# Patient Record
Sex: Female | Born: 1960 | Race: Black or African American | Hispanic: No | Smoking: Current every day smoker
Health system: Southern US, Community
[De-identification: ages and names within clinical notes are randomized; demographics above are authoritative.]

## PROBLEM LIST (undated history)

## (undated) DIAGNOSIS — I341 Nonrheumatic mitral (valve) prolapse: Secondary | ICD-10-CM

## (undated) DIAGNOSIS — K219 Gastro-esophageal reflux disease without esophagitis: Secondary | ICD-10-CM

## (undated) DIAGNOSIS — J329 Chronic sinusitis, unspecified: Secondary | ICD-10-CM

## (undated) HISTORY — PX: ABDOMINAL HYSTERECTOMY: SHX81

## (undated) HISTORY — PX: BUNIONECTOMY: SHX129

## (undated) HISTORY — PX: TONSILLECTOMY: SUR1361

## (undated) HISTORY — PX: APPENDECTOMY: SHX54

---

## 1998-01-11 ENCOUNTER — Emergency Department (HOSPITAL_COMMUNITY): Admission: EM | Admit: 1998-01-11 | Discharge: 1998-01-11 | Payer: Self-pay | Admitting: Emergency Medicine

## 1999-02-24 ENCOUNTER — Emergency Department (HOSPITAL_COMMUNITY): Admission: EM | Admit: 1999-02-24 | Discharge: 1999-02-24 | Payer: Self-pay | Admitting: Internal Medicine

## 1999-11-01 ENCOUNTER — Ambulatory Visit (HOSPITAL_COMMUNITY): Admission: RE | Admit: 1999-11-01 | Discharge: 1999-11-01 | Payer: Self-pay | Admitting: Gastroenterology

## 1999-12-05 ENCOUNTER — Emergency Department (HOSPITAL_COMMUNITY): Admission: EM | Admit: 1999-12-05 | Discharge: 1999-12-05 | Payer: Self-pay | Admitting: Emergency Medicine

## 2000-10-03 ENCOUNTER — Encounter: Payer: Self-pay | Admitting: Gastroenterology

## 2000-10-03 ENCOUNTER — Encounter: Admission: RE | Admit: 2000-10-03 | Discharge: 2000-10-03 | Payer: Self-pay | Admitting: Gastroenterology

## 2000-11-21 ENCOUNTER — Encounter: Payer: Self-pay | Admitting: *Deleted

## 2000-11-22 ENCOUNTER — Observation Stay (HOSPITAL_COMMUNITY): Admission: RE | Admit: 2000-11-22 | Discharge: 2000-11-24 | Payer: Self-pay | Admitting: *Deleted

## 2000-11-22 ENCOUNTER — Encounter (INDEPENDENT_AMBULATORY_CARE_PROVIDER_SITE_OTHER): Payer: Self-pay

## 2000-11-23 ENCOUNTER — Encounter: Payer: Self-pay | Admitting: *Deleted

## 2002-08-18 ENCOUNTER — Emergency Department (HOSPITAL_COMMUNITY): Admission: EM | Admit: 2002-08-18 | Discharge: 2002-08-18 | Payer: Self-pay | Admitting: Emergency Medicine

## 2002-12-22 ENCOUNTER — Encounter: Payer: Self-pay | Admitting: Obstetrics and Gynecology

## 2002-12-22 ENCOUNTER — Ambulatory Visit (HOSPITAL_COMMUNITY): Admission: RE | Admit: 2002-12-22 | Discharge: 2002-12-22 | Payer: Self-pay | Admitting: Obstetrics and Gynecology

## 2003-08-11 ENCOUNTER — Other Ambulatory Visit: Admission: RE | Admit: 2003-08-11 | Discharge: 2003-08-11 | Payer: Self-pay | Admitting: Obstetrics and Gynecology

## 2004-01-05 ENCOUNTER — Encounter: Admission: RE | Admit: 2004-01-05 | Discharge: 2004-01-05 | Payer: Self-pay | Admitting: Internal Medicine

## 2004-02-23 ENCOUNTER — Emergency Department (HOSPITAL_COMMUNITY): Admission: EM | Admit: 2004-02-23 | Discharge: 2004-02-23 | Payer: Self-pay | Admitting: Family Medicine

## 2004-02-25 ENCOUNTER — Emergency Department (HOSPITAL_COMMUNITY): Admission: EM | Admit: 2004-02-25 | Discharge: 2004-02-25 | Payer: Self-pay | Admitting: Family Medicine

## 2004-05-13 ENCOUNTER — Emergency Department (HOSPITAL_COMMUNITY): Admission: EM | Admit: 2004-05-13 | Discharge: 2004-05-13 | Payer: Self-pay | Admitting: Family Medicine

## 2005-09-16 ENCOUNTER — Emergency Department (HOSPITAL_COMMUNITY): Admission: EM | Admit: 2005-09-16 | Discharge: 2005-09-16 | Payer: Self-pay | Admitting: Family Medicine

## 2005-11-14 ENCOUNTER — Emergency Department (HOSPITAL_COMMUNITY): Admission: EM | Admit: 2005-11-14 | Discharge: 2005-11-14 | Payer: Self-pay | Admitting: Family Medicine

## 2006-04-13 ENCOUNTER — Emergency Department (HOSPITAL_COMMUNITY): Admission: EM | Admit: 2006-04-13 | Discharge: 2006-04-13 | Payer: Self-pay | Admitting: Family Medicine

## 2006-07-10 ENCOUNTER — Emergency Department (HOSPITAL_COMMUNITY): Admission: EM | Admit: 2006-07-10 | Discharge: 2006-07-10 | Payer: Self-pay | Admitting: Family Medicine

## 2007-05-04 ENCOUNTER — Emergency Department (HOSPITAL_COMMUNITY): Admission: EM | Admit: 2007-05-04 | Discharge: 2007-05-04 | Payer: Self-pay | Admitting: Emergency Medicine

## 2007-11-14 ENCOUNTER — Encounter: Admission: RE | Admit: 2007-11-14 | Discharge: 2007-11-14 | Payer: Self-pay | Admitting: Obstetrics and Gynecology

## 2007-11-27 ENCOUNTER — Encounter: Admission: RE | Admit: 2007-11-27 | Discharge: 2007-11-27 | Payer: Self-pay | Admitting: Obstetrics and Gynecology

## 2008-04-10 ENCOUNTER — Emergency Department (HOSPITAL_COMMUNITY): Admission: EM | Admit: 2008-04-10 | Discharge: 2008-04-10 | Payer: Self-pay | Admitting: Family Medicine

## 2008-04-16 ENCOUNTER — Inpatient Hospital Stay (HOSPITAL_COMMUNITY): Admission: EM | Admit: 2008-04-16 | Discharge: 2008-04-17 | Payer: Self-pay | Admitting: *Deleted

## 2008-12-11 ENCOUNTER — Encounter: Admission: RE | Admit: 2008-12-11 | Discharge: 2008-12-11 | Payer: Self-pay | Admitting: Obstetrics and Gynecology

## 2009-01-18 ENCOUNTER — Emergency Department (HOSPITAL_COMMUNITY): Admission: EM | Admit: 2009-01-18 | Discharge: 2009-01-18 | Payer: Self-pay | Admitting: Family Medicine

## 2009-02-05 ENCOUNTER — Inpatient Hospital Stay (HOSPITAL_COMMUNITY): Admission: EM | Admit: 2009-02-05 | Discharge: 2009-02-05 | Payer: Self-pay | Admitting: Emergency Medicine

## 2009-09-25 ENCOUNTER — Emergency Department (HOSPITAL_COMMUNITY): Admission: EM | Admit: 2009-09-25 | Discharge: 2009-09-25 | Payer: Self-pay | Admitting: Emergency Medicine

## 2009-09-26 ENCOUNTER — Inpatient Hospital Stay (HOSPITAL_COMMUNITY): Admission: EM | Admit: 2009-09-26 | Discharge: 2009-10-08 | Payer: Self-pay | Admitting: Emergency Medicine

## 2009-10-12 ENCOUNTER — Emergency Department (HOSPITAL_COMMUNITY): Admission: EM | Admit: 2009-10-12 | Discharge: 2009-10-12 | Payer: Self-pay | Admitting: Family Medicine

## 2010-10-19 LAB — CBC
HCT: 40.8 % (ref 36.0–46.0)
Hemoglobin: 11.2 g/dL — ABNORMAL LOW (ref 12.0–15.0)
Hemoglobin: 13.3 g/dL (ref 12.0–15.0)
MCHC: 32.7 g/dL (ref 30.0–36.0)
MCHC: 33.2 g/dL (ref 30.0–36.0)
MCV: 84 fL (ref 78.0–100.0)
MCV: 84.5 fL (ref 78.0–100.0)
Platelets: 241 10*3/uL (ref 150–400)
RBC: 4.02 MIL/uL (ref 3.87–5.11)
RBC: 4.83 MIL/uL (ref 3.87–5.11)
RDW: 15.1 % (ref 11.5–15.5)
RDW: 15.2 % (ref 11.5–15.5)
WBC: 7.2 10*3/uL (ref 4.0–10.5)

## 2010-10-19 LAB — WET PREP, GENITAL: Yeast Wet Prep HPF POC: NONE SEEN

## 2010-10-19 LAB — DIFFERENTIAL
Basophils Absolute: 0 10*3/uL (ref 0.0–0.1)
Basophils Relative: 0 % (ref 0–1)
Basophils Relative: 0 % (ref 0–1)
Eosinophils Absolute: 0 10*3/uL (ref 0.0–0.7)
Eosinophils Absolute: 0 10*3/uL (ref 0.0–0.7)
Lymphocytes Relative: 10 % — ABNORMAL LOW (ref 12–46)
Lymphs Abs: 0.3 10*3/uL — ABNORMAL LOW (ref 0.7–4.0)
Monocytes Absolute: 0.6 10*3/uL (ref 0.1–1.0)
Monocytes Absolute: 0.7 10*3/uL (ref 0.1–1.0)
Neutro Abs: 2.4 10*3/uL (ref 1.7–7.7)
Neutro Abs: 5.6 10*3/uL (ref 1.7–7.7)
Neutrophils Relative %: 77 % (ref 43–77)

## 2010-10-19 LAB — HEPATIC FUNCTION PANEL
AST: 20 U/L (ref 0–37)
Albumin: 4 g/dL (ref 3.5–5.2)
Alkaline Phosphatase: 52 U/L (ref 39–117)
Bilirubin, Direct: 0.1 mg/dL (ref 0.0–0.3)

## 2010-10-19 LAB — GC/CHLAMYDIA PROBE AMP, GENITAL
Chlamydia, DNA Probe: NEGATIVE
GC Probe Amp, Genital: NEGATIVE

## 2010-10-19 LAB — POCT I-STAT, CHEM 8
Chloride: 102 mEq/L (ref 96–112)
Glucose, Bld: 92 mg/dL (ref 70–99)
Hemoglobin: 15.3 g/dL — ABNORMAL HIGH (ref 12.0–15.0)
Sodium: 139 mEq/L (ref 135–145)

## 2010-10-19 LAB — BASIC METABOLIC PANEL
BUN: 3 mg/dL — ABNORMAL LOW (ref 6–23)
Calcium: 8.5 mg/dL (ref 8.4–10.5)
Creatinine, Ser: 0.75 mg/dL (ref 0.4–1.2)
GFR calc Af Amer: >60 mL/min (ref >60)
Potassium: 4 mEq/L (ref 3.5–5.1)
Sodium: 136 mEq/L (ref 135–145)

## 2010-10-23 LAB — URINALYSIS, ROUTINE W REFLEX MICROSCOPIC
Bilirubin Urine: NEGATIVE
Ketones, ur: NEGATIVE mg/dL
Nitrite: NEGATIVE
Protein, ur: NEGATIVE mg/dL
pH: 6 (ref 5.0–8.0)

## 2010-10-23 LAB — BASIC METABOLIC PANEL
BUN: 3 mg/dL — ABNORMAL LOW (ref 6–23)
BUN: 3 mg/dL — ABNORMAL LOW (ref 6–23)
CO2: 30 mEq/L (ref 19–32)
Calcium: 8.3 mg/dL — ABNORMAL LOW (ref 8.4–10.5)
Creatinine, Ser: 0.71 mg/dL (ref 0.4–1.2)
Creatinine, Ser: 0.72 mg/dL (ref 0.4–1.2)
GFR calc non Af Amer: >60 mL/min (ref >60)
GFR calc non Af Amer: >60 mL/min (ref >60)
Glucose, Bld: 109 mg/dL — ABNORMAL HIGH (ref 70–99)
Glucose, Bld: 111 mg/dL — ABNORMAL HIGH (ref 70–99)
Potassium: 3.9 mEq/L (ref 3.5–5.1)
Sodium: 137 mEq/L (ref 135–145)

## 2010-10-23 LAB — CBC
HCT: 33.3 % — ABNORMAL LOW (ref 36.0–46.0)
Hemoglobin: 11.1 g/dL — ABNORMAL LOW (ref 12.0–15.0)
MCHC: 33.2 g/dL (ref 30.0–36.0)
MCHC: 33.3 g/dL (ref 30.0–36.0)
MCV: 85.3 fL (ref 78.0–100.0)
Platelets: 185 10*3/uL (ref 150–400)
Platelets: 201 10*3/uL (ref 150–400)
RBC: 3.91 MIL/uL (ref 3.87–5.11)
RDW: 14.9 % (ref 11.5–15.5)
RDW: 15.2 % (ref 11.5–15.5)
WBC: 4.2 10*3/uL (ref 4.0–10.5)

## 2010-10-23 LAB — WOUND CULTURE

## 2010-10-23 LAB — PHOSPHORUS: Phosphorus: 3.4 mg/dL (ref 2.3–4.6)

## 2010-10-23 LAB — MAGNESIUM: Magnesium: 1.4 mg/dL — ABNORMAL LOW (ref 1.5–2.5)

## 2010-11-06 LAB — URINALYSIS, ROUTINE W REFLEX MICROSCOPIC
Bilirubin Urine: NEGATIVE
Glucose, UA: NEGATIVE mg/dL
Hgb urine dipstick: NEGATIVE
Specific Gravity, Urine: 1.01 (ref 1.005–1.030)

## 2010-11-06 LAB — CBC
HCT: 33.7 % — ABNORMAL LOW (ref 36.0–46.0)
HCT: 39 % (ref 36.0–46.0)
Hemoglobin: 13.2 g/dL (ref 12.0–15.0)
MCHC: 33.8 g/dL (ref 30.0–36.0)
MCV: 87.2 fL (ref 78.0–100.0)
MCV: 87.6 fL (ref 78.0–100.0)
Platelets: 207 10*3/uL (ref 150–400)
RBC: 3.84 MIL/uL — ABNORMAL LOW (ref 3.87–5.11)
RBC: 4.48 MIL/uL (ref 3.87–5.11)
WBC: 6.8 10*3/uL (ref 4.0–10.5)
WBC: 7.1 10*3/uL (ref 4.0–10.5)

## 2010-11-06 LAB — BASIC METABOLIC PANEL
Chloride: 105 mEq/L (ref 96–112)
Creatinine, Ser: 0.75 mg/dL (ref 0.4–1.2)
GFR calc Af Amer: >60 mL/min (ref >60)
GFR calc non Af Amer: >60 mL/min (ref >60)
Potassium: 4.2 mEq/L (ref 3.5–5.1)

## 2010-11-06 LAB — POCT I-STAT, CHEM 8
BUN: 7 mg/dL (ref 6–23)
Chloride: 98 mEq/L (ref 96–112)
HCT: 43 % (ref 36.0–46.0)
Sodium: 135 mEq/L (ref 135–145)

## 2010-11-06 LAB — DIFFERENTIAL
Basophils Absolute: 0 10*3/uL (ref 0.0–0.1)
Basophils Relative: 0 % (ref 0–1)
Lymphocytes Relative: 17 % (ref 12–46)
Neutro Abs: 5.1 10*3/uL (ref 1.7–7.7)

## 2010-11-06 LAB — MAGNESIUM: Magnesium: 2 mg/dL (ref 1.5–2.5)

## 2010-11-06 LAB — GLUCOSE, CAPILLARY: Glucose-Capillary: 106 mg/dL — ABNORMAL HIGH (ref 70–99)

## 2010-11-16 DIAGNOSIS — I10 Essential (primary) hypertension: Secondary | ICD-10-CM | POA: Insufficient documentation

## 2010-11-16 DIAGNOSIS — R21 Rash and other nonspecific skin eruption: Secondary | ICD-10-CM | POA: Insufficient documentation

## 2010-11-16 DIAGNOSIS — L251 Unspecified contact dermatitis due to drugs in contact with skin: Secondary | ICD-10-CM | POA: Insufficient documentation

## 2010-11-17 ENCOUNTER — Emergency Department (HOSPITAL_COMMUNITY)
Admission: EM | Admit: 2010-11-17 | Discharge: 2010-11-17 | Disposition: A | Discharge status: Home / Self Care | Payer: No Typology Code available for payment source | Attending: Emergency Medicine | Admitting: Emergency Medicine

## 2010-12-01 ENCOUNTER — Other Ambulatory Visit: Payer: Self-pay | Admitting: Obstetrics and Gynecology

## 2010-12-01 DIAGNOSIS — Z1231 Encounter for screening mammogram for malignant neoplasm of breast: Secondary | ICD-10-CM

## 2010-12-12 ENCOUNTER — Ambulatory Visit
Admission: RE | Admit: 2010-12-12 | Discharge: 2010-12-12 | Disposition: A | Discharge status: Home / Self Care | Payer: No Typology Code available for payment source | Source: Ambulatory Visit | Attending: Obstetrics and Gynecology | Admitting: Obstetrics and Gynecology

## 2010-12-12 DIAGNOSIS — Z1231 Encounter for screening mammogram for malignant neoplasm of breast: Secondary | ICD-10-CM

## 2010-12-13 NOTE — Cardiovascular Report (Signed)
NAME:  Cynthia Palmer, Cynthia Palmer NO.:  [id number]   MEDICAL RECORD NO.:  [id number]          PATIENT TYPE:  INP   LOCATION:  4734                         FACILITY:  [facility]   PHYSICIAN:  Nanetta Batty, M.D.   DATE OF BIRTH:  02/17/1961   DATE OF PROCEDURE:  04/16/2008  DATE OF DISCHARGE:                            CARDIAC CATHETERIZATION   [name] is a 50 year old African American female, mother of 2  children who was admitted to the ER today with chest pain, which began  last night.  She had no acute EKG changes, and her enzymes were  negative.  She did have a negative Myoview a year ago.  Other problems  include tobacco abuse, treated hypertension, and a strong family history  for heart disease.  She presents now for diagnostic coronary  arteriography to define anatomy and rule out ischemic etiology.   DESCRIPTION OF PROCEDURE:  The patient was brought to the Second Floor  [facility] in the postabsorptive state.  She was  premedicated with p.o. Valium.  Her right groin was prepped and shaved  in the usual sterile fashion.  Xylocaine 1% was used for local  anesthesia.  A 6-French sheath was inserted into the right femoral  artery using standard Seldinger technique.  A 6-French right-left  Judkins diagnostic catheter as well as 6-French pigtail catheter were  used for selective cholangiography, left ventriculography, and  supravalvular aortography.  Visipaque dye was used for the entirety of  the case.  Retrograde aortic, left ventricular, and pullback pressures  were recorded.   HEMODYNAMICS:  1. Aortic systolic pressure 91, diastolic pressure 55.  2. Ventricle systolic pressure 94, end-diastolic pressure 9.   Selective coronary angiography:  1. Left main normal.  2. LAD normal.  3. Left circumflex normal.  4. Right coronary artery was dominant and normal.   Left ventriculography; RAO left ventriculogram was performed using 25 mL  of Visipaque  dye at 12 mL per second.  The overall LVEF was estimated  greater than 60% without focal wall motion abnormalities.   Supravalvular aortography; performed in the LAO view using 20 mL of  Visipaque dye at 20 mL per second.  There was no AI noted.  There was  moderate dilatation of the super annular ascending aorta, which tapered  back to normal in the arch.  There is no dissection.  Arch vessels were  intact.   IMPRESSION:  [name] has normal coronaries arteries, normal left  ventricular function.  Her aortic root appears generous.  I am going to  get a CT angiogram to size her aortic root and rule out thoracic aortic  aneurysm.  If this is within normal limits, then further evaluation for  noncardiac chest pain including gastrointestinal evaluation, plus/minus  EGG/gallbladder ultrasound may be required.   The sheath was removed and pressure on the groin to achieve hemostasis.  The patient left the lab in stable condition.  Dr. Fleet Contras have  been notified of these results.      Nanetta Batty, M.D.  Electronically Signed     JB/MEDQ  D:  04/16/2008  T:  04/17/2008  Job:  [id number]   cc:   Second [facility]  [facility]  Fleet Contras, MD

## 2010-12-16 NOTE — Discharge Summary (Signed)
Cynthia Palmer, Cynthia Palmer            ACCOUNT NO.:  [id number]   MEDICAL RECORD NO.:  [id number]          PATIENT TYPE:  INP   LOCATION:  4734                         FACILITY:  [facility]   PHYSICIAN:  Fleet Contras, M.D.    DATE OF BIRTH:  1960-08-28   DATE OF ADMISSION:  04/16/2008  DATE OF DISCHARGE:  04/17/2008                               DISCHARGE SUMMARY   HISTORY OF PRESENTING ILLNESS:  [name] is a 50 year old lady with  past medical history significant for hypertension, mitral valve  prolapse, allergic rhinitis, bronchitis, gastroesophageal reflux  disease, tobacco abuse, and recurrent chest pain.  She presented with  another episode of atypical chest pain, but due to her previous history,  Cardiology consult was requested.   HOSPITAL COURSE:  The patient was seen by Dr. [facility].  The patient underwent cardiac catheterization on  April 16, 2008, revealing normal coronaries, normal LV systolic  function.  CT of the abdomen was also performed to rule out thoracic  aneurysm.  This was negative.  On April 17, 2008, she was feeling  much better.  Her pain had subsided and she was considered stable for  discharge home.   DISCHARGE DIAGNOSES:  1. Atypical chest pain.  2. Gastroesophageal reflux disease.  3. Systemic hypertension.  4. Hypokalemia, repleted.   CONDITION ON DISCHARGE:  Stable.   DISPOSITION:  For home.   DISCHARGE MEDICATIONS:  1. Vicodin 5/500, 1 p.o. q.6.  2. Prilosec 40 mg daily.  3. Avelox 400 mg p.o. daily for 7 days.  4. Lysol 5 mg p.o. daily.      Fleet Contras, M.D.  Electronically Signed     EA/MEDQ  D:  06/11/2008  T:  06/11/2008  Job:  [id number]

## 2010-12-16 NOTE — Op Note (Signed)
[facility]  Patient:    Cynthia Palmer, Cynthia Palmer                   MRN: [id number] Proc. Date: 11/22/00 Adm. Date:  [id number] Attending:  Deniece Ree                           Operative Report  PREOPERATIVE DIAGNOSIS:       Leiomyomata uteri.  POSTOPERATIVE DIAGNOSES:      1. Leiomyomata uteri.                               2. Pending pathology.  OPERATION:                    Total vaginal hysterectomy.  SURGEON:                      Deniece Ree, M.D.  ANESTHESIA:                   General.  ESTIMATED BLOOD LOSS:         100 cc.  COMPLICATIONS:                None.  DISPOSITION:                  The patient tolerated the procedure well and returned to the recovery room in satisfactory condition.  DESCRIPTION OF PROCEDURE:     The patient was taken to the operating room, prepped and draped in the usual fashion for vaginal surgery. The anterior/posterior lip of the cervix was grasped with Milus Banister at which time the cervix was then circumscribed with the use of a knife. The anterior/posterior vaginal mucosa was pushed forward and the posterior cul-de-sac was entered with using peritoneal scissors. The anterior area of the peritoneal cavity was also entered using curved Mayo scissors at which time the uterosacral and cardinal ligaments were doubly clamped and ligated using #1 chromic in a Heaney stitch. The uterine vessels were ______, again using #1 chromic in Heaney stitches. This was done in sequential fashion until the uterus was removed from the pelvic cavity. At this point, hemostasis was present and the sponge and needle count was correct x 2. The peritoneum was then closed using a #1 chromic in a pursestring-type stitch. ______ stitches were put in bilaterally for vaginal support following which the cuff was closed in a horizontal fashion using interrupted #1 chromic stitches. The bladder was checked and urine was clear, at  which time vaginal packing was placed in the vagina and the procedure terminated. The patient tolerated the procedure well and returned to the recovery room in satisfactory condition. DD:  11/22/00 TD:  11/22/00 Job: [id number]

## 2010-12-16 NOTE — Procedures (Signed)
[facility]  Patient:    Cynthia Palmer, Cynthia Palmer                     MRN: [id number] Proc. Date: 11/01/99 Adm. Date:  [id number] Attending:  Charna Elizabeth CC:         Meliton Rattan, M.D.                           Procedure Report  DATE OF BIRTH:  04/16/1961  PROCEDURE PERFORMED:  Esophagogastroduodenoscopy.  ENDOSCOPIST:  Anselmo Rod, M.D.  INSTRUMENT USED:  Olympus video panendoscope.  INDICATIONS:  Epigastric pain and blood in the stool in a 50 year old black female who had peptic ulcer disease, esophagitis, and gastritis, etc.  PREPROCEDURE PREPARATION:  An informed consent was procured from the patient and the patient was fasted for eight hours prior to the procedure.  PREPROCEDURE PHYSICAL EXAMINATION:  VITAL SIGNS:  Stable.  NECK:  Supple.  CHEST:  Clear to auscultation.  HEART:  S1, S2 regular.  ABDOMEN:  Soft, with normal abdominal bowel sounds.  Epigastric tenderness on palpation with guarding.  No rebound or rigidity.  No hepatosplenomegaly or masses palpable.  DESCRIPTION OF PROCEDURE:  The patient was placed in the left lateral decubitus  position and sedated with 70 mg of Demerol and 2 mg of Versed intravenously. Once the patient was adequately sedated and maintained on low-flow oxygen and continuous cardiac monitoring, the Olympus video panendoscope was advanced through the mouth piece, over the tongue, into the esophagus under direct vision.  The entire esophagus appeared normal, without evidence of rings, strictures, masses, lesions, or esophagitis.  The scope was then advanced into the stomach.  There was healthy-appearing gastric mucosa, including the midbody and antrum.  The duodenal bulb and the small bowel distal to the bulb up to 70.0 cm appeared normal. There was no outlet obstruction.  The patient tolerated the procedure well.  A small hiatal hernia was seen on high retroflexion.  IMPRESSION:  Normal  esophagogastroduodenoscopy except for a small hiatal hernia.  RECOMMENDATIONS: 1. The patient has been advised to try PPIs for symptomatic relief. 2. Antireflux measures have been advocated. 3. Outpatient followup is advised for further recommendations.DD:  11/01/99 TD:  11/01/99 Job: [id number]

## 2010-12-16 NOTE — Discharge Summary (Signed)
[facility]  Patient:    Cynthia Palmer, Cynthia Palmer                   MRN: [id number] Adm. Date:  [id number] Disc. Date: [id number] Attending:  Deniece Ree                           Discharge Summary  SUMMARY:                      The patient is a 50 year old gravida 3, para 3 who has been having chronic uterine pain for approximately seven months.  She has also been having menometrorrhagia and complains of chronic pelvic pain, especially during intercourse.  She was admitted at this time for a total vaginal hysterectomy.  On the day of admission patient underwent a total vaginal hysterectomy from which she tolerated the procedure very well without any problems.  Postoperatively she did very well without any complications and was discharged on the second postoperative day.  During this time a CAT scan was utilized for a questionable spot on her lung and she was also seen by Dr. Petra Kuba, pulmonary specialist, who will follow her also after discharge. DD:  01/05/01 TD:  01/06/01 Job: [id number]

## 2010-12-16 NOTE — H&P (Signed)
[facility]  Patient:    Cynthia Palmer, Cynthia Palmer                   MRN: [id number] Adm. Date:  [id number] Attending:  Deniece Ree                         History and Physical  HISTORY:                      Patient is a 50 year old gravida 3 para 3 female who has been having severe and chronic uterine pain for approximately seven months.  She has also had very heavy and long menses.  She has also complained of a chronic amount of pelvic pressure and pelvic pain, especially during intercourse.  She is being admitted at this time for a total vaginal hysterectomy.  The patient was seen in my office on several occasions in which she has utilized antibiotics and also pain relievers for relief without any success.  The procedure that is scheduled to be done has been totally explained to patient who understands and all of her questions have been answered.  She understands the possible complications, which she accepts.  PAST MEDICAL HISTORY:         Significant in that patient has had three children.  She has also had an appendectomy.  MEDICATIONS:                  At present, she is taking chronic pain medication for the uterine pain.  PHYSICAL EXAMINATION:  GENERAL:                      Revealed a well-developed, well-nourished female in no acute distress.  HEENT:                        Within normal limits.  NECK:                         Supple.  BREASTS:                      Without masses, tenderness, or discharge.  LUNGS:                        Clear to percussion and auscultation.  HEART:                        Normal sinus rhythm without murmur, rub, or gallop.  ABDOMEN:                      Benign.  EXTREMITIES AND NEUROLOGIC:   Within normal limits.  PELVIC:                       Revealed external genitalia and BUSB within normal limits.  The vagina is clear, cervix firm and nontender.  The uterus is approximately 10-12 weeks in size,  numerous myomas present.  Severe pain on palpation.  There is a second degree prolapse of the uterus.  The adnexa is benign.  DIAGNOSES:                    1. Leiomyomata uteri.  2. Possibly adenomyosis.                               3. Chronic uterine pain.                               4. Severe dysmenorrhea and dyspareunia.  PLAN:                         Total vaginal hysterectomy. DD:  11/22/00 TD:  11/22/00 Job: [id number]

## 2011-02-25 IMAGING — CT CT HEAD W/O CM
1 series · 16 of 30 positions shown, 20 images · non-contrast
Comparison: None

CLINICAL DATA: Syncope, history hypertension

CT HEAD WITHOUT CONTRAST
TECHNIQUE: Contiguous axial images were obtained from the base of
the skull through the vertex without contrast.

[Series 2: head trauma 4.8 h37s · axial · 0.43mm/px · z∈[-121,+8]mm · 16 of 30 slices shown, 20 images]
[im 2/30  brain]
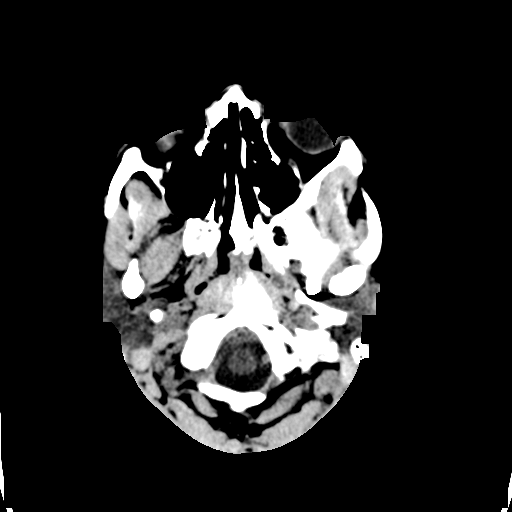
[im 2/30  bone]
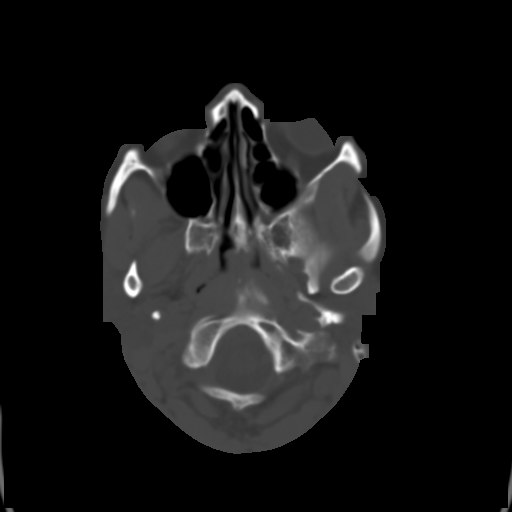
[im 4/30  brain]
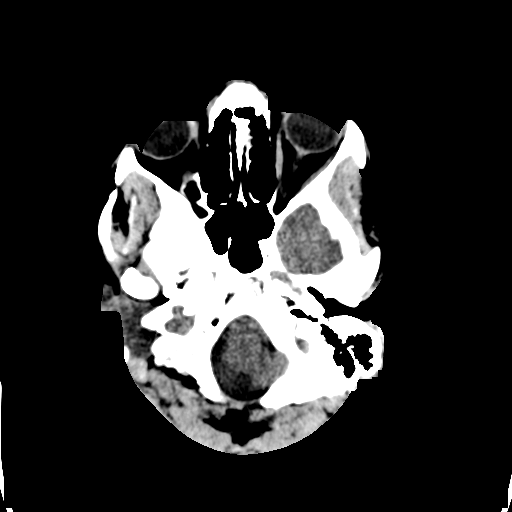
[im 6/30  brain]
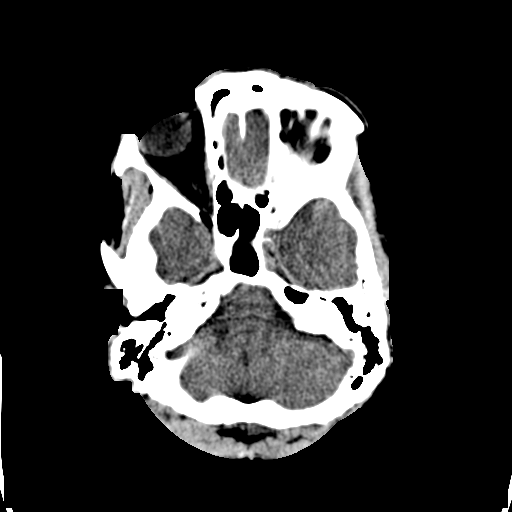
[im 8/30  brain]
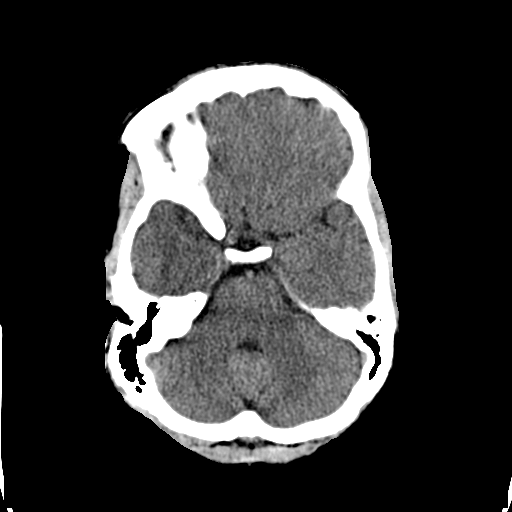
[im 9/30  brain]
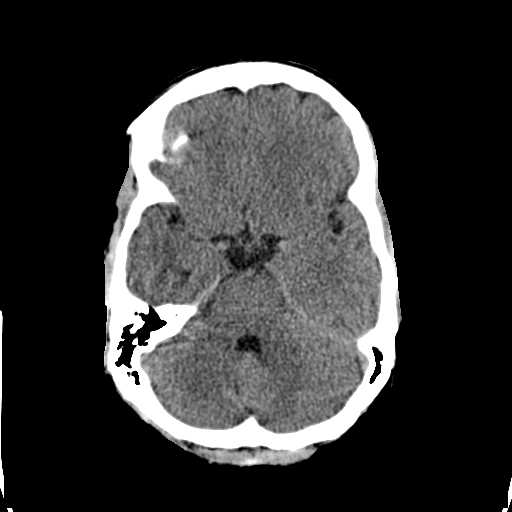
[im 9/30  bone]
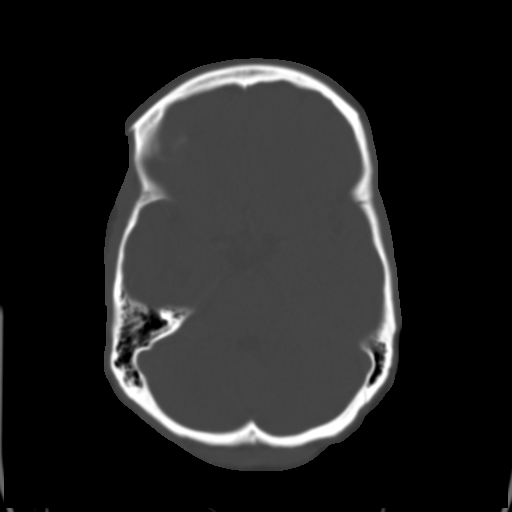
[im 11/30  brain]
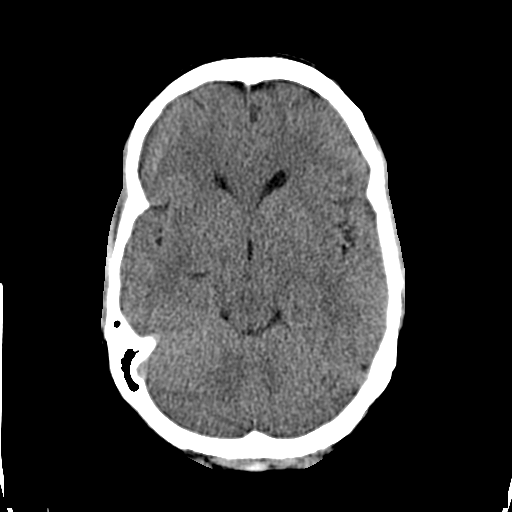
[im 13/30  brain]
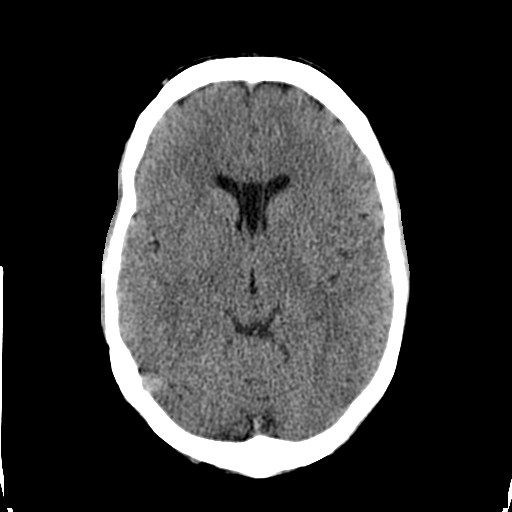
[im 15/30  brain]
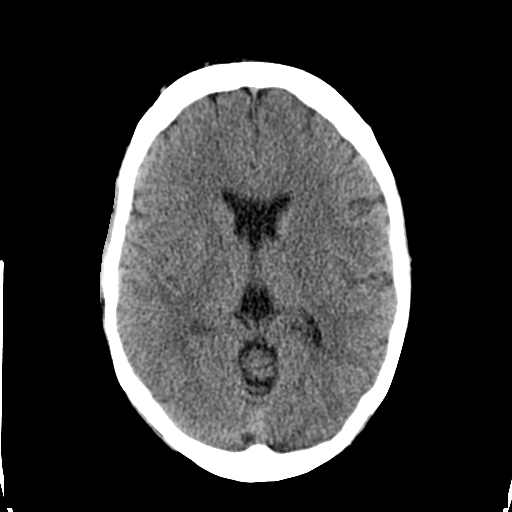
[im 16/30  brain]
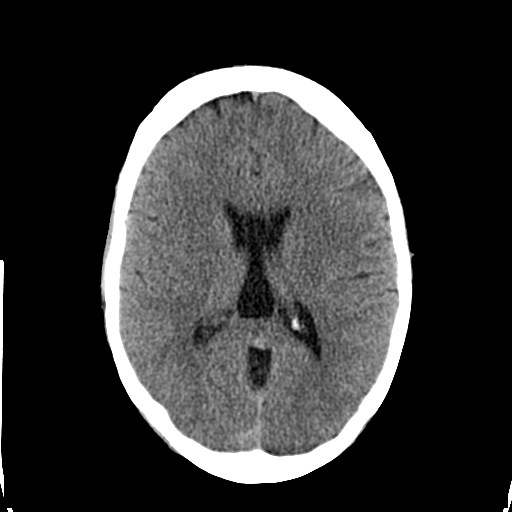
[im 16/30  bone]
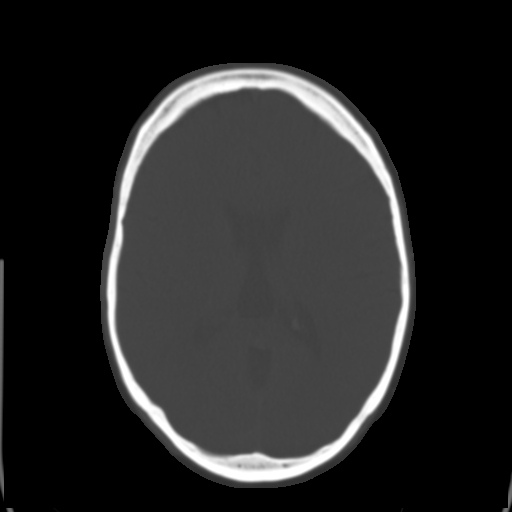
[im 18/30  brain]
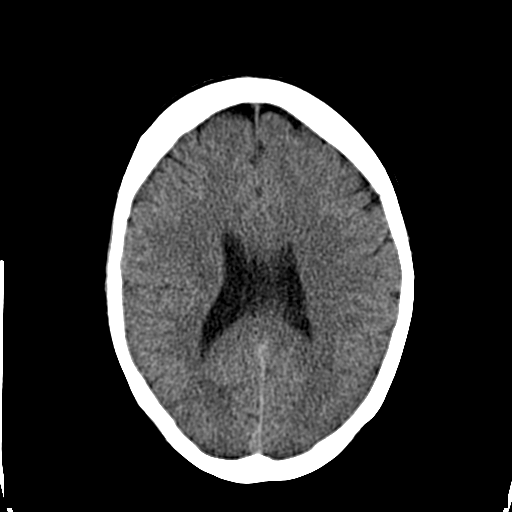
[im 20/30  brain]
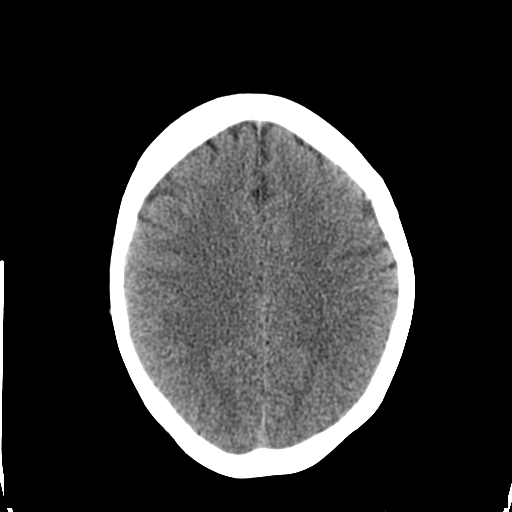
[im 22/30  brain]
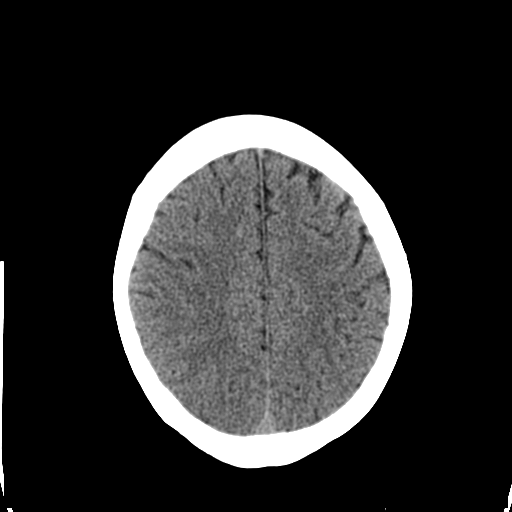
[im 23/30  brain]
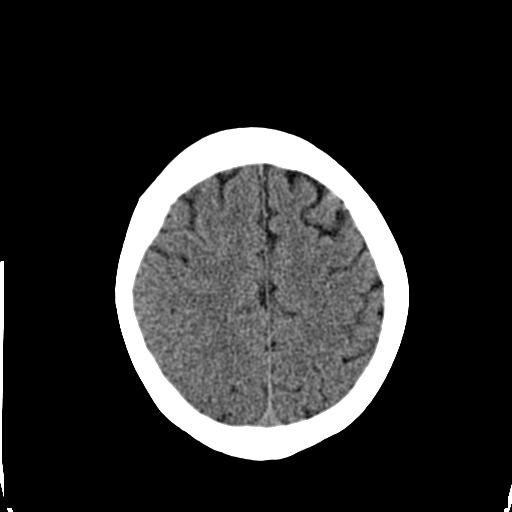
[im 23/30  bone]
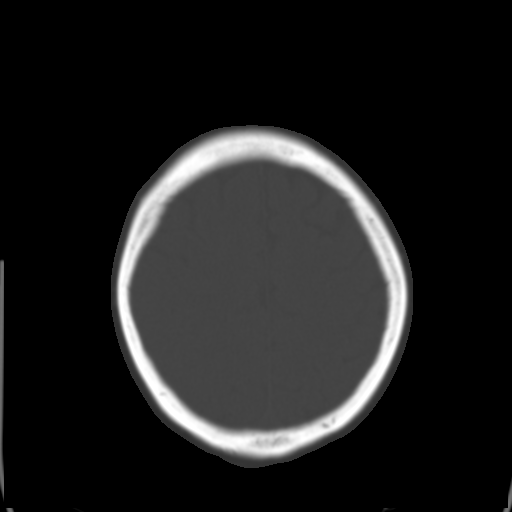
[im 25/30  brain]
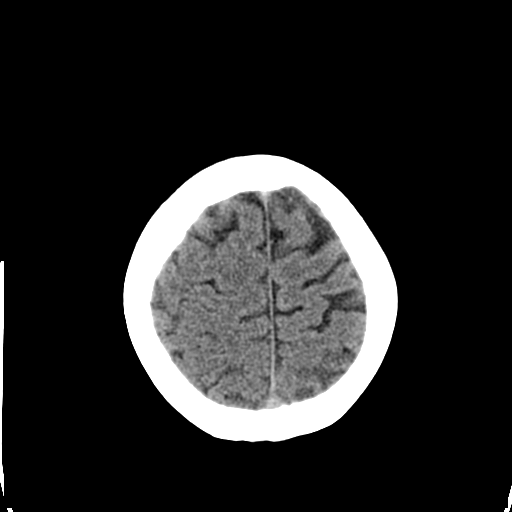
[im 27/30  brain]
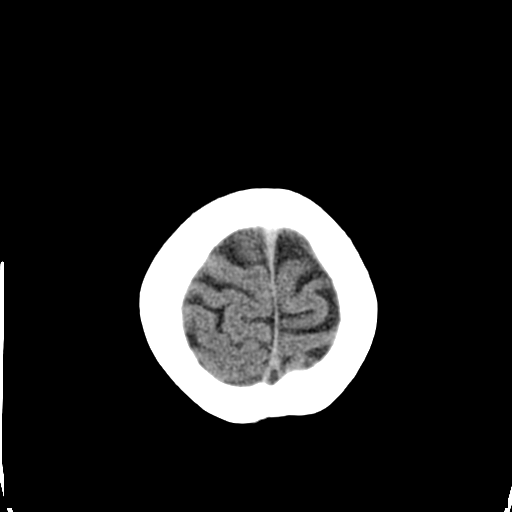
[im 29/30  brain]
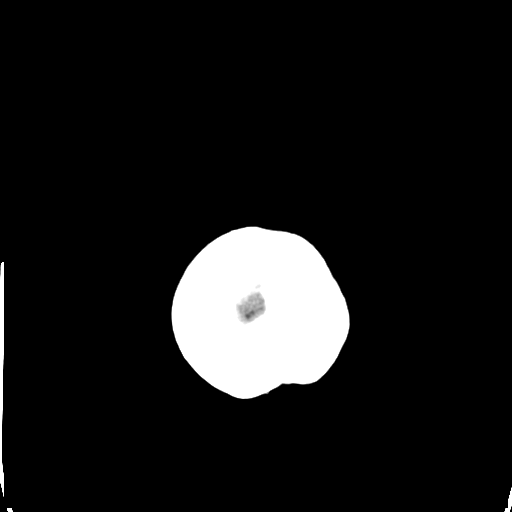

[16 of 30 positions shown; findings below may reference images not displayed]

FINDINGS: Cavum septum pellucidum and vergae, normal variants.
Normal ventricular morphology otherwise seen.
No midline shift or mass effect.
Normal appearance of brain parenchyma.
No intracranial hemorrhage, mass lesion, or acute infarction.
Visualized paranasal sinuses and mastoid air cells clear.
Bones unremarkable.
IMPRESSION: No acute intracranial abnormalities.

## 2011-05-01 LAB — POCT I-STAT, CHEM 8
BUN: 9
Calcium, Ion: 1.14
Chloride: 105
Creatinine, Ser: 1
Glucose, Bld: 91
HCT: 39
Potassium: 3.5

## 2011-05-01 LAB — APTT: aPTT: 34

## 2011-05-01 LAB — COMPREHENSIVE METABOLIC PANEL
ALT: 9
AST: 14
CO2: 29
Chloride: 105
Creatinine, Ser: 0.74
GFR calc Af Amer: >60
GFR calc non Af Amer: >60
Glucose, Bld: 84
Total Bilirubin: 0.7

## 2011-05-01 LAB — TROPONIN I
Troponin I: 0.01
Troponin I: <0.01

## 2011-05-01 LAB — BASIC METABOLIC PANEL
BUN: 7
CO2: 25
Calcium: 9.1
Creatinine, Ser: 0.71
GFR calc non Af Amer: >60
Glucose, Bld: 49 — ABNORMAL LOW
Sodium: 139

## 2011-05-01 LAB — CK TOTAL AND CKMB (NOT AT ARMC)
CK, MB: 1.5
Relative Index: INVALID
Relative Index: INVALID
Relative Index: INVALID
Total CK: 68
Total CK: 99

## 2011-05-01 LAB — DIFFERENTIAL
Basophils Absolute: 0
Eosinophils Relative: 0
Lymphocytes Relative: 13
Monocytes Absolute: 1.1 — ABNORMAL HIGH
Monocytes Relative: 10
Neutro Abs: 8.8 — ABNORMAL HIGH

## 2011-05-01 LAB — CK ISOENZYMES
CK-MB: 0 % (ref <5)
CK-MM: 100 % (ref 95–100)
Creatine Kinase-Total: 68 U/L (ref 29–143)

## 2011-05-01 LAB — CBC
HCT: 33.2 — ABNORMAL LOW
HCT: 38.5
Hemoglobin: 12.8
MCHC: 33.7
MCV: 87.7
Platelets: 220
RBC: 4.36
RDW: 14.4
WBC: 4.9

## 2011-05-01 LAB — POCT CARDIAC MARKERS: Troponin i, poc: <0.05

## 2011-05-03 LAB — CULTURE, ROUTINE-ABSCESS

## 2011-05-11 LAB — POCT CARDIAC MARKERS
CKMB, poc: <1 — ABNORMAL LOW
Operator id: 272551
Troponin i, poc: <0.05

## 2011-05-11 LAB — I-STAT 8, (EC8 V) (CONVERTED LAB)
Acid-Base Excess: 3 — ABNORMAL HIGH
Operator id: 272551
TCO2: 27
pCO2, Ven: 33.4 — ABNORMAL LOW
pH, Ven: 7.496 — ABNORMAL HIGH

## 2011-09-11 ENCOUNTER — Encounter (HOSPITAL_COMMUNITY): Payer: Self-pay | Admitting: *Deleted

## 2011-09-11 ENCOUNTER — Emergency Department (INDEPENDENT_AMBULATORY_CARE_PROVIDER_SITE_OTHER)
Admission: EM | Admit: 2011-09-11 | Discharge: 2011-09-11 | Disposition: A | Discharge status: Home / Self Care | Payer: Self-pay | Source: Home / Self Care | Attending: Family Medicine | Admitting: Family Medicine

## 2011-09-11 DIAGNOSIS — H109 Unspecified conjunctivitis: Secondary | ICD-10-CM

## 2011-09-11 DIAGNOSIS — J019 Acute sinusitis, unspecified: Secondary | ICD-10-CM

## 2011-09-11 HISTORY — DX: Gastro-esophageal reflux disease without esophagitis: K21.9

## 2011-09-11 HISTORY — DX: Chronic sinusitis, unspecified: J32.9

## 2011-09-11 MED ORDER — MOMETASONE FUROATE 0.1 % EX CREA: TOPICAL_CREAM | Freq: Every day | CUTANEOUS | Status: AC

## 2011-09-11 MED ORDER — TOBRAMYCIN 0.3 % OP SOLN: 1.0000 [drp] | Freq: Four times a day (QID) | OPHTHALMIC | Status: AC

## 2011-09-11 MED ORDER — AZITHROMYCIN 250 MG PO TABS: ORAL_TABLET | ORAL | Status: AC

## 2011-09-11 MED ORDER — FLUTICASONE PROPIONATE 50 MCG/ACT NA SUSP: 2.0000 | Freq: Every day | NASAL | Status: DC

## 2011-09-11 NOTE — ED Provider Notes (Signed)
History     CSN: [id number]  Arrival date & time 09/11/11  1315   First MD Initiated Contact with Patient 09/11/11 1435      Chief Complaint  Patient presents with  . Conjunctivitis  . Sinusitis    (Consider location/radiation/quality/duration/timing/severity/associated sxs/prior treatment) Patient is a 51 y.o. female presenting with conjunctivitis and sinusitis. The history is provided by the patient.  Conjunctivitis  The current episode started yesterday (eyelid crusting, matting, sinus cong, pnd.). The problem has been gradually worsening. The problem is mild. Associated symptoms include congestion, rhinorrhea, eye discharge and eye redness.  Sinusitis  Associated symptoms include congestion.    Past Medical History  Diagnosis Date  . Sinusitis   . Acid reflux     Past Surgical History  Procedure Date  . Appendectomy   . Abdominal hysterectomy   . Tonsillectomy   . Bunionectomy     No family history on file.  History  Substance Use Topics  . Smoking status: Former Smoker    Quit date: 06/11/2011  . Smokeless tobacco: Former Neurosurgeon    Quit date: 06/11/2011  . Alcohol Use: Yes     socially    OB History    Grav Para Term Preterm Abortions TAB SAB Ect Mult Living                  Review of Systems  Constitutional: Negative.   HENT: Positive for congestion, rhinorrhea and postnasal drip.   Eyes: Positive for discharge and redness.  Respiratory: Negative.   Cardiovascular: Negative.   Gastrointestinal: Negative.     Allergies  Review of patient's allergies indicates no known allergies.  Home Medications   Current Outpatient Rx  Name Route Sig Dispense Refill  . ESTROGENS CONJUGATED PO Oral Take by mouth.    Elita Boone PO Oral Take by mouth 1 day or 1 dose.    Marland Kitchen OMEPRAZOLE PO Oral Take by mouth 1 day or 1 dose.    Marland Kitchen AZITHROMYCIN 250 MG PO TABS  Take as directed on pack 6 each 0  . FLUTICASONE PROPIONATE 50 MCG/ACT NA SUSP Nasal Place 2 sprays into  the nose daily. 1 g 2  . MOMETASONE FUROATE 0.1 % EX CREA Topical Apply topically daily. To eyelids 45 g 0  . TOBRAMYCIN SULFATE 0.3 % OP SOLN Both Eyes Place 1 drop into both eyes every 6 (six) hours. 5 mL 0    BP 134/89  Pulse 82  Temp(Src) 98.2 F (36.8 C) (Oral)  Resp 16  SpO2 99%  Physical Exam  Nursing note and vitals reviewed. Constitutional: She is oriented to person, place, and time. She appears well-developed and well-nourished.  HENT:  Head: Normocephalic.  Right Ear: External ear normal.  Left Ear: External ear normal.  Mouth/Throat: Oropharynx is clear and moist.  Eyes: Conjunctivae and EOM are normal. Pupils are equal, round, and reactive to light.       Left upper and lower lid mild erythema  Neck: Normal range of motion. Neck supple.  Cardiovascular: Normal rate.   Pulmonary/Chest: Effort normal and breath sounds normal.  Lymphadenopathy:    She has no cervical adenopathy.  Neurological: She is alert and oriented to person, place, and time.  Skin: Skin is warm and dry.    ED Course  Procedures (including critical care time)  Labs Reviewed - No data to display No results found.   1. Sinusitis acute   2. Conjunctivitis       MDM  Barkley Bruns, MD 09/11/11 1450

## 2011-09-11 NOTE — ED Notes (Signed)
Pt c/o left eye being matted together yesterday am and left side of face swollen.  Feels more congested on left.  States she has chronic sinusitis.  C/o headache also.  Denies pain or itching to left eye, states it just feels gritty.

## 2011-10-03 ENCOUNTER — Encounter (HOSPITAL_COMMUNITY): Payer: Self-pay | Admitting: Emergency Medicine

## 2011-10-03 ENCOUNTER — Emergency Department (INDEPENDENT_AMBULATORY_CARE_PROVIDER_SITE_OTHER)
Admission: EM | Admit: 2011-10-03 | Discharge: 2011-10-03 | Disposition: A | Discharge status: Home Health | Payer: Self-pay | Source: Home / Self Care | Attending: Family Medicine | Admitting: Family Medicine

## 2011-10-03 DIAGNOSIS — L500 Allergic urticaria: Secondary | ICD-10-CM

## 2011-10-03 DIAGNOSIS — IMO0001 Reserved for inherently not codable concepts without codable children: Secondary | ICD-10-CM

## 2011-10-03 MED ORDER — PREDNISONE 20 MG PO TABS: ORAL_TABLET | ORAL | Status: AC

## 2011-10-03 MED ORDER — PREDNISONE 5 MG PO TABS: ORAL_TABLET | ORAL | Status: AC

## 2011-10-03 MED ORDER — METHYLPREDNISOLONE SODIUM SUCC 125 MG IJ SOLR
INTRAMUSCULAR | Status: AC
  Filled 2011-10-03: qty 2

## 2011-10-03 MED ORDER — METHYLPREDNISOLONE SODIUM SUCC 125 MG IJ SOLR
125.0000 mg | Freq: Once | INTRAMUSCULAR | Status: AC
  Administered 2011-10-03: 125 mg via INTRAMUSCULAR

## 2011-10-03 MED ORDER — METHYLPREDNISOLONE SODIUM SUCC 125 MG IJ SOLR: 125.0000 mg | Freq: Once | INTRAMUSCULAR | Status: DC

## 2011-10-03 NOTE — ED Provider Notes (Signed)
History     CSN: [id number]  Arrival date & time 10/03/11  4259   First MD Initiated Contact with Patient 10/03/11 1028      Chief Complaint  Patient presents with  . Allergic Reaction    (Consider location/radiation/quality/duration/timing/severity/associated sxs/prior treatment) HPI Comments: Cynthia Palmer comes in for evaluation of a pleuritic rash over her scalp, face, and neck. She reports using a shampoo on Saturday and her symptoms started on Sunday. She reports that she uses shampoo before with a similar reaction. She denies any lip swelling tongue swelling throat, irritation, or trouble breathing. She's tried Benadryl and over-the-counter hydrocortisone without relief.  Patient is a 51 y.o. female presenting with allergic reaction. The history is provided by the patient.  Allergic Reaction The primary symptoms are  rash and urticaria. The primary symptoms do not include wheezing, shortness of breath, nausea or angioedema. The current episode started more than 2 days ago. The problem has not changed since onset. The rash began 2 to 7 days ago. The rash appears on the scalp, neck, head and face. The pain associated with the rash is moderate. The rash is associated with itching.  The urticaria began more than 2 days ago. The urticaria has been unchanged since its onset. Urticaria is a new problem. Urticaria is located on the scalp, head, face and back.  Significant symptoms also include itching.    Past Medical History  Diagnosis Date  . Sinusitis   . Acid reflux     Past Surgical History  Procedure Date  . Appendectomy   . Abdominal hysterectomy   . Tonsillectomy   . Bunionectomy     No family history on file.  History  Substance Use Topics  . Smoking status: Current Everyday Smoker    Last Attempt to Quit: 06/11/2011  . Smokeless tobacco: Former Neurosurgeon    Quit date: 06/11/2011  . Alcohol Use: Yes     socially    OB History    Grav Para Term Preterm Abortions TAB SAB  Ect Mult Living                  Review of Systems  Constitutional: Negative.   HENT: Negative.   Eyes: Negative.   Respiratory: Negative.  Negative for shortness of breath and wheezing.   Cardiovascular: Negative.   Gastrointestinal: Negative.  Negative for nausea.  Genitourinary: Negative.   Musculoskeletal: Negative.   Skin: Positive for itching and rash.  Neurological: Negative.     Allergies  Review of patient's allergies indicates no known allergies.  Home Medications   Current Outpatient Rx  Name Route Sig Dispense Refill  . ESTROGENS CONJUGATED PO Oral Take by mouth.    Marland Kitchen FLUTICASONE PROPIONATE 50 MCG/ACT NA SUSP Nasal Place 2 sprays into the nose daily. 1 g 2  . XYZAL PO Oral Take by mouth 1 day or 1 dose.    . MOMETASONE FUROATE 0.1 % EX CREA Topical Apply topically daily. To eyelids 45 g 0  . OMEPRAZOLE PO Oral Take by mouth 1 day or 1 dose.    Marland Kitchen PREDNISONE 20 MG PO TABS  Take two tablets on days 1 and 2, then one tablet daily on days 3 through 5. Then start next dose of tablets. 7 tablet 0  . PREDNISONE 5 MG PO TABS  Take two tablets daily on days 6 through 9, then take one tablet on days 10 through 14 13 tablet 0    BP 126/89  Pulse 80  Temp(Src) 98.6 F (37 C) (Oral)  Resp 20  SpO2 100%  Physical Exam  Nursing note and vitals reviewed. Constitutional: She is oriented to person, place, and time. She appears well-developed and well-nourished.  HENT:  Head: Normocephalic and atraumatic.  Mouth/Throat: Uvula is midline, oropharynx is clear and moist and mucous membranes are normal.  Eyes: EOM are normal.  Neck: Normal range of motion.  Pulmonary/Chest: Effort normal and breath sounds normal. She has no decreased breath sounds. She has no wheezes. She has no rhonchi.  Musculoskeletal: Normal range of motion.  Neurological: She is alert and oriented to person, place, and time.  Skin: Skin is warm and dry. Rash noted. Rash is urticarial.     Psychiatric:  Her behavior is normal.    ED Course  Procedures (including critical care time)  Labs Reviewed - No data to display No results found.   1. Allergic reaction, urticaria       MDM  Given 125 mg solumedrol IM in clinic; given rx for prednisone taper; advised OTC antihistamine; return precautions given if respiratory difficulty develops        Richardo Priest, MD 10/03/11 1120

## 2011-10-03 NOTE — ED Notes (Signed)
PT HERE WITH CONSTANT ITCHING AND BREAKOUT FROM ALLERGIC REACTION TO SHAMPOO THAT SHE USED LAST Sunday BUT HAS WORSENED YESTERDAY WITH BURNING AND SPREADING.DENIES SOB OR CP.PT USED OTC BENADRYL AND CORTISONE CREAM BUT NOT WORKING.SX SPREAD ALL OVER

## 2011-10-03 NOTE — Discharge Instructions (Signed)
Take the steroid course as directed. Take them in sequence as directed starting with the 20 mg tablet prescription. Then moving to the 5 mg tablet prescription. You may also continue taking over-the-counter antihistamine such as Benadryl or Claritin as discussed. Please return to care immediately should she have any worsening of the rash symptoms or shortness of breath. Or symptoms such as tongue swelling lip swelling, or throat, itching.

## 2012-01-02 ENCOUNTER — Other Ambulatory Visit: Payer: Self-pay | Admitting: Obstetrics and Gynecology

## 2012-01-02 DIAGNOSIS — G4700 Insomnia, unspecified: Secondary | ICD-10-CM

## 2012-01-03 ENCOUNTER — Telehealth: Payer: Self-pay

## 2012-01-03 NOTE — Telephone Encounter (Signed)
Rx request for ambien 10mg  Awaiting AVS approval.

## 2012-01-05 ENCOUNTER — Other Ambulatory Visit: Payer: Self-pay

## 2012-01-05 ENCOUNTER — Telehealth: Payer: Self-pay

## 2012-01-05 NOTE — Telephone Encounter (Signed)
Per AVS Ambien 10 mg can be called in 30 pills no RF's  Pt will need a sleep study done  Rx was called in to pt pharm.

## 2012-12-16 ENCOUNTER — Emergency Department (HOSPITAL_COMMUNITY)
Admission: EM | Admit: 2012-12-16 | Discharge: 2012-12-16 | Disposition: A | Discharge status: Home / Self Care | Payer: No Typology Code available for payment source | Source: Home / Self Care | Attending: Emergency Medicine | Admitting: Emergency Medicine

## 2012-12-16 ENCOUNTER — Encounter (HOSPITAL_COMMUNITY): Payer: Self-pay | Admitting: Emergency Medicine

## 2012-12-16 DIAGNOSIS — L259 Unspecified contact dermatitis, unspecified cause: Secondary | ICD-10-CM

## 2012-12-16 DIAGNOSIS — L239 Allergic contact dermatitis, unspecified cause: Secondary | ICD-10-CM

## 2012-12-16 MED ORDER — TRIAMCINOLONE ACETONIDE 0.1 % EX CREA: TOPICAL_CREAM | Freq: Two times a day (BID) | CUTANEOUS | Status: AC

## 2012-12-16 MED ORDER — HYDROXYZINE HCL 25 MG PO TABS: 25.0000 mg | ORAL_TABLET | Freq: Three times a day (TID) | ORAL | Status: DC | PRN

## 2012-12-16 NOTE — ED Notes (Signed)
Pt c/o rash on neck x 4 days. Started as a line of bumps at back of neck and spread all the way around. Is very red and itchy. Pt has tried using hydrocortisone cream and took a benadryl with no relief. Patient denies any new hygeine products or jewelry. Patient is alert and oriented.

## 2012-12-16 NOTE — ED Provider Notes (Signed)
History     CSN: [id number]  Arrival date & time 12/16/12  1043   First MD Initiated Contact with Patient 12/16/12 1150      Chief Complaint  Patient presents with  . Rash    (Consider location/radiation/quality/duration/timing/severity/associated sxs/prior treatment) HPI Comments: Patient presents urgent care describing that about 4 days ago started with a red and itchy rash on the anterior aspect of her right neck. Rash is very itchy and it feels like when she had a previous skin reaction to a hair product in the past. At this point she cannot think of any particular product that she could have used a could of triggered this again. She did apply some hydrocortisone taken some Benadryl for it but no relief.  The rash is located in the anterior lateral aspect of her right neck and is 3-4 patches on her back ( midline). The rash does not hurt, does not burn is just itchy.  Patient is a 52 y.o. female presenting with rash. The history is provided by the patient.  Rash Location:  Shoulder/arm Shoulder/arm rash location:  R shoulder Quality: itchiness and redness   Quality: not blistering, not bruising, not burning, not painful, not peeling, not scaling and not weeping   Severity:  Moderate Onset quality:  Sudden Duration:  4 days Context: not animal contact, not hot tub use, not plant contact and not sick contacts   Associated symptoms: no fatigue, no fever, no headaches, no joint pain, no myalgias, no shortness of breath, no throat swelling and not wheezing     Past Medical History  Diagnosis Date  . Sinusitis   . Acid reflux     Past Surgical History  Procedure Laterality Date  . Appendectomy    . Abdominal hysterectomy    . Tonsillectomy    . Bunionectomy      No family history on file.  History  Substance Use Topics  . Smoking status: Current Every Day Smoker    Last Attempt to Quit: 06/11/2011  . Smokeless tobacco: Former Neurosurgeon    Quit date: 06/11/2011  .  Alcohol Use: Yes     Comment: socially    OB History   Grav Para Term Preterm Abortions TAB SAB Ect Mult Living                  Review of Systems  Constitutional: Negative for fever, activity change, appetite change and fatigue.  Respiratory: Negative for shortness of breath and wheezing.   Musculoskeletal: Negative for myalgias and arthralgias.  Skin: Positive for color change and rash. Negative for pallor and wound.  Neurological: Negative for headaches.    Allergies  Review of patient's allergies indicates no known allergies.  Home Medications   Current Outpatient Rx  Name  Route  Sig  Dispense  Refill  . ESTROGENS CONJUGATED PO   Oral   Take by mouth.         . EXPIRED: fluticasone (FLONASE) 50 MCG/ACT nasal spray   Nasal   Place 2 sprays into the nose daily.   1 g   2   . hydrOXYzine (ATARAX/VISTARIL) 25 MG tablet   Oral   Take 1 tablet (25 mg total) by mouth every 8 (eight) hours as needed for itching.   12 tablet   0   . Levocetirizine Dihydrochloride (XYZAL PO)   Oral   Take by mouth 1 day or 1 dose.         Marland Kitchen OMEPRAZOLE PO  Oral   Take by mouth 1 day or 1 dose.         . triamcinolone cream (KENALOG) 0.1 %   Topical   Apply topically 2 (two) times daily. Apply cream twice a day in affected areas can be used for 2 weeks   45 g   0   . zolpidem (AMBIEN) 10 MG tablet      TAKE ONE TABLET BY MOUTH AT BEDTIME AS NEEDED   30 tablet   0     BP 137/86  Pulse 80  Temp(Src) 98.3 F (36.8 C) (Oral)  Resp 16  SpO2 100%  Physical Exam  Nursing note and vitals reviewed. Constitutional: Vital signs are normal. She appears well-developed and well-nourished.  Non-toxic appearance. She does not have a sickly appearance. She does not appear ill. No distress.  Neurological: She is alert.  Skin: Rash noted. There is erythema. No pallor.       ED Course  Procedures (including critical care time)  Labs Reviewed - No data to display No  results found.   1. Allergic dermatitis       MDM  Suspect rash is secondary to an allergenic topical dermatitis most likely chemical. Will treat patient with both penicillin and hydroxyzine. I have discussed with patient some of the characteristics and patterns of the rash makes it very likely to be shingles, she agrees. We also discussed that if any changes and patterns, rash becoming tender, vesicular lesions to calls and will have a prescription ready for patient reviewed with me several pictures to illustrate examples of shingles.   Will leave a authorization for patient to take both acyclovir and prednisone nursing staff to call in prescriptions if necessary,   Rx  Acyclovir 400 mg TID X 5 DAYS (qs) Rx  Prednisone 40mg  daily x 5 days (qs)   Jimmie Molly, MD 12/16/12 1314

## 2013-01-26 ENCOUNTER — Encounter (HOSPITAL_COMMUNITY): Payer: Self-pay | Admitting: Emergency Medicine

## 2013-01-26 ENCOUNTER — Emergency Department (HOSPITAL_COMMUNITY)
Admission: EM | Admit: 2013-01-26 | Discharge: 2013-01-26 | Disposition: A | Discharge status: Home / Self Care | Payer: No Typology Code available for payment source | Source: Home / Self Care | Attending: Emergency Medicine | Admitting: Emergency Medicine

## 2013-01-26 DIAGNOSIS — M62838 Other muscle spasm: Secondary | ICD-10-CM

## 2013-01-26 DIAGNOSIS — M542 Cervicalgia: Secondary | ICD-10-CM

## 2013-01-26 MED ORDER — KETOROLAC TROMETHAMINE 60 MG/2ML IM SOLN
60.0000 mg | Freq: Once | INTRAMUSCULAR | Status: AC
  Administered 2013-01-26: 60 mg via INTRAMUSCULAR

## 2013-01-26 MED ORDER — NAPROXEN 500 MG PO TABS: 500.0000 mg | ORAL_TABLET | Freq: Two times a day (BID) | ORAL | Status: DC

## 2013-01-26 MED ORDER — KETOROLAC TROMETHAMINE 60 MG/2ML IM SOLN
INTRAMUSCULAR | Status: AC
  Filled 2013-01-26: qty 2

## 2013-01-26 MED ORDER — CYCLOBENZAPRINE HCL 10 MG PO TABS: 10.0000 mg | ORAL_TABLET | Freq: Three times a day (TID) | ORAL | Status: DC | PRN

## 2013-01-26 MED ORDER — METHYLPREDNISOLONE ACETATE 80 MG/ML IJ SUSP
INTRAMUSCULAR | Status: AC
  Filled 2013-01-26: qty 1

## 2013-01-26 MED ORDER — METHYLPREDNISOLONE ACETATE 40 MG/ML IJ SUSP
80.0000 mg | Freq: Once | INTRAMUSCULAR | Status: AC
  Administered 2013-01-26: 80 mg via INTRAMUSCULAR

## 2013-01-26 NOTE — ED Notes (Signed)
Pt c/o left neck and shoulder pain since Tuesday. Pt has used aleve with no relief.  Pt states "questions is she slept wrong". Pt denies injury.

## 2013-01-26 NOTE — ED Provider Notes (Signed)
History    CSN: [id number] Arrival date & time 01/26/13  1458  First MD Initiated Contact with Patient 01/26/13 1618     Chief Complaint  Patient presents with  . Neck Pain    left neck and shoulder pain since tuesday.   . Shoulder Pain   (Consider location/radiation/quality/duration/timing/severity/associated sxs/prior Treatment) HPI Comments: 52 year old female presents complaining of left sided neck and shoulder pain for the past 5 days. She noticed this first when she will go in the morning. She denies any known injury to the neck or shoulder. The pain is constant on the area described and is nonradiating. The pain is exacerbated by any movements of the arm or neck, particularly looking towards the affected side. She has taken some over-the-counter Aleve and ibuprofen without any relief of her symptoms. She's never experienced anything like this before. She denies any numbness in the arm, pain in any other joints or areas.  Patient is a 52 y.o. female presenting with neck pain and shoulder pain.  Neck Pain Associated symptoms: no chest pain, no fever and no weakness   Shoulder Pain Pertinent negatives include no chest pain, no abdominal pain and no shortness of breath.   Past Medical History  Diagnosis Date  . Sinusitis   . Acid reflux    Past Surgical History  Procedure Laterality Date  . Appendectomy    . Abdominal hysterectomy    . Tonsillectomy    . Bunionectomy     History reviewed. No pertinent family history. History  Substance Use Topics  . Smoking status: Current Every Day Smoker    Last Attempt to Quit: 06/11/2011  . Smokeless tobacco: Former Neurosurgeon    Quit date: 06/11/2011  . Alcohol Use: Yes     Comment: socially   OB History   Grav Para Term Preterm Abortions TAB SAB Ect Mult Living                 Review of Systems  Constitutional: Negative for fever and chills.  HENT: Positive for neck pain.   Eyes: Negative for visual disturbance.  Respiratory:  Negative for cough and shortness of breath.   Cardiovascular: Negative for chest pain, palpitations and leg swelling.  Gastrointestinal: Negative for nausea, vomiting and abdominal pain.  Endocrine: Negative for polydipsia and polyuria.  Genitourinary: Negative for dysuria, urgency and frequency.  Musculoskeletal: Positive for arthralgias (shoulder pain on the left side ). Negative for myalgias.  Skin: Negative for rash.  Neurological: Negative for dizziness, weakness and light-headedness.    Allergies  Review of patient's allergies indicates no known allergies.  Home Medications   Current Outpatient Rx  Name  Route  Sig  Dispense  Refill  . Levocetirizine Dihydrochloride (XYZAL PO)   Oral   Take by mouth 1 day or 1 dose.         . cyclobenzaprine (FLEXERIL) 10 MG tablet   Oral   Take 1 tablet (10 mg total) by mouth 3 (three) times daily as needed for muscle spasms.   30 tablet   0   . ESTROGENS CONJUGATED PO   Oral   Take by mouth.         . EXPIRED: fluticasone (FLONASE) 50 MCG/ACT nasal spray   Nasal   Place 2 sprays into the nose daily.   1 g   2   . hydrOXYzine (ATARAX/VISTARIL) 25 MG tablet   Oral   Take 1 tablet (25 mg total) by mouth every 8 (eight) hours  as needed for itching.   12 tablet   0   . naproxen (NAPROSYN) 500 MG tablet   Oral   Take 1 tablet (500 mg total) by mouth 2 (two) times daily.   60 tablet   0   . OMEPRAZOLE PO   Oral   Take by mouth 1 day or 1 dose.         . zolpidem (AMBIEN) 10 MG tablet      TAKE ONE TABLET BY MOUTH AT BEDTIME AS NEEDED   30 tablet   0    BP 141/98  Pulse 90  Temp(Src) 98.8 F (37.1 C) (Oral)  Resp 18  SpO2 100% Physical Exam  Nursing note and vitals reviewed. Constitutional: She is oriented to person, place, and time. Vital signs are normal. She appears well-developed and well-nourished. No distress.  HENT:  Head: Atraumatic.  Eyes: EOM are normal. Pupils are equal, round, and reactive to  light.  Cardiovascular: Normal rate, regular rhythm and normal heart sounds.  Exam reveals no gallop and no friction rub.   No murmur heard. Pulmonary/Chest: Effort normal and breath sounds normal. No respiratory distress. She has no wheezes. She has no rales.  Abdominal: Soft. There is no tenderness.  Musculoskeletal:       Left shoulder: She exhibits tenderness (Mild tenderness at the superior lateral joint line. Also pain in the area of the supraspinatus). She exhibits normal range of motion, no bony tenderness, no swelling, no effusion, no deformity and normal strength.       Cervical back: She exhibits tenderness (Left paraspinous musculature). She exhibits normal range of motion (Pain exacerbated by looking towards the left shoulder as well as looking up at the ceiling), no bony tenderness, no swelling and no spasm.  Neurological: She is alert and oriented to person, place, and time. She has normal strength.  Skin: Skin is warm and dry. She is not diaphoretic.  Psychiatric: She has a normal mood and affect. Her behavior is normal. Judgment normal.    ED Course  Procedures (including critical care time) Labs Reviewed - No data to display No results found. 1. Neck pain on left side   2. Muscle spasm     MDM  We'll treat for musculoskeletal pain/strain and she will return if she does not improve or follow up with her primary care provider   Meds ordered this encounter  Medications  . methylPREDNISolone acetate (DEPO-MEDROL) injection 80 mg    Sig:   . ketorolac (TORADOL) injection 60 mg    Sig:   . naproxen (NAPROSYN) 500 MG tablet    Sig: Take 1 tablet (500 mg total) by mouth 2 (two) times daily.    Dispense:  60 tablet    Refill:  0  . cyclobenzaprine (FLEXERIL) 10 MG tablet    Sig: Take 1 tablet (10 mg total) by mouth 3 (three) times daily as needed for muscle spasms.    Dispense:  30 tablet    Refill:  0     Graylon Good, PA-C 01/26/13 1700

## 2013-01-27 NOTE — ED Provider Notes (Signed)
Medical screening examination/treatment/procedure(s) were performed by non-physician practitioner and as supervising physician I was immediately available for consultation/collaboration.   MORENO-COLL,Koleton Duchemin; MD  Kristyanna Barcelo Moreno-Coll, MD 01/27/13 0813 

## 2014-07-06 ENCOUNTER — Emergency Department (INDEPENDENT_AMBULATORY_CARE_PROVIDER_SITE_OTHER)
Admission: EM | Admit: 2014-07-06 | Discharge: 2014-07-06 | Disposition: A | Discharge status: Home / Self Care | Payer: No Typology Code available for payment source | Source: Home / Self Care | Attending: Family Medicine | Admitting: Family Medicine

## 2014-07-06 ENCOUNTER — Encounter (HOSPITAL_COMMUNITY): Payer: Self-pay | Admitting: *Deleted

## 2014-07-06 DIAGNOSIS — J40 Bronchitis, not specified as acute or chronic: Secondary | ICD-10-CM

## 2014-07-06 DIAGNOSIS — J0100 Acute maxillary sinusitis, unspecified: Secondary | ICD-10-CM

## 2014-07-06 HISTORY — DX: Nonrheumatic mitral (valve) prolapse: I34.1

## 2014-07-06 MED ORDER — GUAIFENESIN-CODEINE 100-10 MG/5ML PO SOLN: 5.0000 mL | Freq: Every evening | ORAL | Status: DC | PRN

## 2014-07-06 MED ORDER — IPRATROPIUM BROMIDE 0.06 % NA SOLN: 2.0000 | Freq: Four times a day (QID) | NASAL | Status: DC

## 2014-07-06 MED ORDER — PREDNISONE 10 MG PO TABS: 30.0000 mg | ORAL_TABLET | Freq: Every day | ORAL | Status: DC

## 2014-07-06 MED ORDER — AZITHROMYCIN 250 MG PO TABS: 250.0000 mg | ORAL_TABLET | Freq: Every day | ORAL | Status: DC

## 2014-07-06 NOTE — ED Notes (Signed)
She said I think I have a sinus infection.  Head stopped up and ears stopped up.  Cough is prod.  C/o nausea and had diarrhea Thur. and Fri.

## 2014-07-06 NOTE — ED Provider Notes (Signed)
Cynthia Palmer is a 53 y.o. female who presents to Urgent Care today for sinus pain pressure discharge cough congestion and runny nose. Symptoms present for about 5 days and consistent with previous episodes of bronchitis and sinusitis. No significant wheezing or shortness of breath. Cough is productive. Patient had one or 2 episodes of diarrhea. No abdominal pain or vomiting.   Past Medical History  Diagnosis Date  . Sinusitis   . Acid reflux   . Prolapse of mitral valve    Past Surgical History  Procedure Laterality Date  . Appendectomy    . Abdominal hysterectomy    . Tonsillectomy    . Bunionectomy     History  Substance Use Topics  . Smoking status: Current Every Day Smoker -- 0.10 packs/day    Types: Cigarettes    Last Attempt to Quit: 06/11/2011  . Smokeless tobacco: Former NeurosurgeonUser    Quit date: 06/11/2011  . Alcohol Use: Yes     Comment: socially   ROS as above Medications: No current facility-administered medications for this encounter.   Current Outpatient Prescriptions  Medication Sig Dispense Refill  . azithromycin (ZITHROMAX) 250 MG tablet Take 1 tablet (250 mg total) by mouth daily. Take first 2 tablets together, then 1 every day until finished. 6 tablet 0  . cyclobenzaprine (FLEXERIL) 10 MG tablet Take 1 tablet (10 mg total) by mouth 3 (three) times daily as needed for muscle spasms. 30 tablet 0  . ESTROGENS CONJUGATED PO Take by mouth.    . fluticasone (FLONASE) 50 MCG/ACT nasal spray Place 2 sprays into the nose daily. 1 g 2  . guaiFENesin-codeine 100-10 MG/5ML syrup Take 5 mLs by mouth at bedtime as needed for cough. 120 mL 0  . hydrOXYzine (ATARAX/VISTARIL) 25 MG tablet Take 1 tablet (25 mg total) by mouth every 8 (eight) hours as needed for itching. 12 tablet 0  . ipratropium (ATROVENT) 0.06 % nasal spray Place 2 sprays into both nostrils 4 (four) times daily. 15 mL 1  . Levocetirizine Dihydrochloride (XYZAL PO) Take by mouth 1 day or 1 dose.    . naproxen  (NAPROSYN) 500 MG tablet Take 1 tablet (500 mg total) by mouth 2 (two) times daily. 60 tablet 0  . OMEPRAZOLE PO Take by mouth 1 day or 1 dose.    . predniSONE (DELTASONE) 10 MG tablet Take 3 tablets (30 mg total) by mouth daily. 15 tablet 0  . zolpidem (AMBIEN) 10 MG tablet TAKE ONE TABLET BY MOUTH AT BEDTIME AS NEEDED 30 tablet 0   No Known Allergies   Exam:  BP 153/101 mmHg  Pulse 94  Temp(Src) 97.7 F (36.5 C) (Oral)  Resp 18  SpO2 100% Gen: Well NAD HEENT: EOMI,  MMM posterior pharynx with cobblestoning. Normal tympanic membranes bilaterally. Clear nasal discharge present. Lungs: Normal work of breathing. CTABL Heart: RRR no MRG Abd: NABS, Soft. Nondistended, Nontender Exts: Brisk capillary refill, warm and well perfused.   No results found for this or any previous visit (from the past 24 hour(s)). No results found.  Assessment and Plan: 53 y.o. female with bronchitis and sinusitis likely viral. Treatment with prednisone Atrovent nasal spray codeine-based cough medicine. Use azithromycin if not better.  Discussed warning signs or symptoms. Please see discharge instructions. Patient expresses understanding.     Rodolph BongEvan S Gabrianna Fassnacht, MD 07/06/14 [phone]

## 2014-07-06 NOTE — Discharge Instructions (Signed)
Thank you for coming in today. °Call or go to the emergency room if you get worse, have trouble breathing, have chest pains, or palpitations.  ° °Acute Bronchitis °Bronchitis is inflammation of the airways that extend from the windpipe into the lungs (bronchi). The inflammation often causes mucus to develop. This leads to a cough, which is the most common symptom of bronchitis.  °In acute bronchitis, the condition usually develops suddenly and goes away over time, usually in a couple weeks. Smoking, allergies, and asthma can make bronchitis worse. Repeated episodes of bronchitis may cause further lung problems.  °CAUSES °Acute bronchitis is most often caused by the same virus that causes a cold. The virus can spread from person to person (contagious) through coughing, sneezing, and touching contaminated objects. °SIGNS AND SYMPTOMS  °· Cough.   °· Fever.   °· Coughing up mucus.   °· Body aches.   °· Chest congestion.   °· Chills.   °· Shortness of breath.   °· Sore throat.   °DIAGNOSIS  °Acute bronchitis is usually diagnosed through a physical exam. Your health care provider will also ask you questions about your medical history. Tests, such as chest X-rays, are sometimes done to rule out other conditions.  °TREATMENT  °Acute bronchitis usually goes away in a couple weeks. Oftentimes, no medical treatment is necessary. Medicines are sometimes given for relief of fever or cough. Antibiotic medicines are usually not needed but may be prescribed in certain situations. In some cases, an inhaler may be recommended to help reduce shortness of breath and control the cough. A cool mist vaporizer may also be used to help thin bronchial secretions and make it easier to clear the chest.  °HOME CARE INSTRUCTIONS °· Get plenty of rest.   °· Drink enough fluids to keep your urine clear or pale yellow (unless you have a medical condition that requires fluid restriction). Increasing fluids may help thin your respiratory secretions  (sputum) and reduce chest congestion, and it will prevent dehydration.   °· Take medicines only as directed by your health care provider. °· If you were prescribed an antibiotic medicine, finish it all even if you start to feel better. °· Avoid smoking and secondhand smoke. Exposure to cigarette smoke or irritating chemicals will make bronchitis worse. If you are a smoker, consider using nicotine gum or skin patches to help control withdrawal symptoms. Quitting smoking will help your lungs heal faster.   °· Reduce the chances of another bout of acute bronchitis by washing your hands frequently, avoiding people with cold symptoms, and trying not to touch your hands to your mouth, nose, or eyes.   °· Keep all follow-up visits as directed by your health care provider.   °SEEK MEDICAL CARE IF: °Your symptoms do not improve after 1 week of treatment.  °SEEK IMMEDIATE MEDICAL CARE IF: °· You develop an increased fever or chills.   °· You have chest pain.   °· You have severe shortness of breath. °· You have bloody sputum.   °· You develop dehydration. °· You faint or repeatedly feel like you are going to pass out. °· You develop repeated vomiting. °· You develop a severe headache. °MAKE SURE YOU:  °· Understand these instructions. °· Will watch your condition. °· Will get help right away if you are not doing well or get worse. °Document Released: 08/24/2004 Document Revised: 12/01/2013 Document Reviewed: 01/07/2013 °[employer]® Patient Information ©2015 [employer]. This information is not intended to replace advice given to you by your health care provider. Make sure you discuss any questions you have with your   health care provider. ° °

## 2014-07-28 ENCOUNTER — Emergency Department (INDEPENDENT_AMBULATORY_CARE_PROVIDER_SITE_OTHER)
Admission: EM | Admit: 2014-07-28 | Discharge: 2014-07-28 | Disposition: A | Discharge status: Home / Self Care | Payer: No Typology Code available for payment source | Source: Home / Self Care | Attending: Family Medicine | Admitting: Family Medicine

## 2014-07-28 ENCOUNTER — Encounter (HOSPITAL_COMMUNITY): Payer: Self-pay | Admitting: Emergency Medicine

## 2014-07-28 DIAGNOSIS — K529 Noninfective gastroenteritis and colitis, unspecified: Secondary | ICD-10-CM

## 2014-07-28 DIAGNOSIS — J208 Acute bronchitis due to other specified organisms: Secondary | ICD-10-CM

## 2014-07-28 LAB — POCT URINALYSIS DIP (DEVICE)
BILIRUBIN URINE: NEGATIVE
GLUCOSE, UA: NEGATIVE mg/dL
Hgb urine dipstick: NEGATIVE
KETONES UR: NEGATIVE mg/dL
Nitrite: NEGATIVE
Protein, ur: NEGATIVE mg/dL
SPECIFIC GRAVITY, URINE: 1.015 (ref 1.005–1.030)
Urobilinogen, UA: 0.2 mg/dL (ref 0.0–1.0)
pH: 7 (ref 5.0–8.0)

## 2014-07-28 LAB — POCT PREGNANCY, URINE: PREG TEST UR: NEGATIVE

## 2014-07-28 MED ORDER — ONDANSETRON HCL 4 MG PO TABS: 4.0000 mg | ORAL_TABLET | Freq: Four times a day (QID) | ORAL | Status: DC

## 2014-07-28 MED ORDER — LEVOFLOXACIN 500 MG PO TABS: 500.0000 mg | ORAL_TABLET | Freq: Every day | ORAL | Status: DC

## 2014-07-28 NOTE — Discharge Instructions (Signed)
Take all of medicine, drink lots of fluids, no more smoking, see your doctor if further problems  °

## 2014-07-28 NOTE — ED Notes (Signed)
Patient c/o recurrent bronchitis sx onset 2 weeks ago. She was seen and treated and then sx retrogressed. Patient also has stomach pain with nausea and vomiting and bloating since x 3 days. Patient is in NAD.

## 2014-07-28 NOTE — ED Provider Notes (Signed)
CSN: [id number]     Arrival date & time 07/28/14  1741 History   First MD Initiated Contact with Patient 07/28/14 1758     Chief Complaint  Patient presents with  . Bronchitis  . Abdominal Pain   (Consider location/radiation/quality/duration/timing/severity/associated sxs/prior Treatment) Patient is a 53 y.o. female presenting with abdominal pain. The history is provided by the patient.  Abdominal Pain Pain location:  Epigastric Pain quality: burning   Pain radiates to:  Does not radiate Pain severity:  Mild Onset quality:  Gradual Duration:  3 days Progression:  Unchanged Chronicity:  New Context: alcohol use and previous surgery   Associated symptoms: cough and nausea   Associated symptoms: no diarrhea, no fever, no shortness of breath and no vomiting     Past Medical History  Diagnosis Date  . Sinusitis   . Acid reflux   . Prolapse of mitral valve    Past Surgical History  Procedure Laterality Date  . Appendectomy    . Abdominal hysterectomy    . Tonsillectomy    . Bunionectomy     Family History  Problem Relation Age of Onset  . Stroke Mother   . Heart attack Father   . Hypertension Father   . Diabetes Father    History  Substance Use Topics  . Smoking status: Current Every Day Smoker -- 0.10 packs/day    Types: Cigarettes    Last Attempt to Quit: 06/11/2011  . Smokeless tobacco: Former NeurosurgeonUser    Quit date: 06/11/2011  . Alcohol Use: Yes     Comment: socially   OB History    No data available     Review of Systems  Constitutional: Negative.  Negative for fever.  Respiratory: Positive for cough. Negative for shortness of breath and wheezing.   Cardiovascular: Negative.  Negative for palpitations and leg swelling.  Gastrointestinal: Positive for nausea and abdominal pain. Negative for vomiting and diarrhea.    Allergies  Review of patient's allergies indicates no known allergies.  Home Medications   Prior to Admission medications   Medication  Sig Start Date End Date Taking? Authorizing Provider  azithromycin (ZITHROMAX) 250 MG tablet Take 1 tablet (250 mg total) by mouth daily. Take first 2 tablets together, then 1 every day until finished. 07/06/14   Rodolph BongEvan S Corey, MD  cyclobenzaprine (FLEXERIL) 10 MG tablet Take 1 tablet (10 mg total) by mouth 3 (three) times daily as needed for muscle spasms. 01/26/13   Adrian BlackwaterZachary H Baker, PA-C  ESTROGENS CONJUGATED PO Take by mouth.    Historical Provider, MD  fluticasone (FLONASE) 50 MCG/ACT nasal spray Place 2 sprays into the nose daily. 09/11/11 09/10/12  Linna HoffJames D Dahl Higinbotham, MD  guaiFENesin-codeine 100-10 MG/5ML syrup Take 5 mLs by mouth at bedtime as needed for cough. 07/06/14   Rodolph BongEvan S Corey, MD  hydrOXYzine (ATARAX/VISTARIL) 25 MG tablet Take 1 tablet (25 mg total) by mouth every 8 (eight) hours as needed for itching. 12/16/12   Jimmie MollyPaolo Coll, MD  ipratropium (ATROVENT) 0.06 % nasal spray Place 2 sprays into both nostrils 4 (four) times daily. 07/06/14   Rodolph BongEvan S Corey, MD  Levocetirizine Dihydrochloride (XYZAL PO) Take by mouth 1 day or 1 dose.    Historical Provider, MD  levofloxacin (LEVAQUIN) 500 MG tablet Take 1 tablet (500 mg total) by mouth daily. 07/28/14   Linna HoffJames D Harleigh Civello, MD  naproxen (NAPROSYN) 500 MG tablet Take 1 tablet (500 mg total) by mouth 2 (two) times daily. 01/26/13   Adrian BlackwaterZachary H  Baker, PA-C  OMEPRAZOLE PO Take by mouth 1 day or 1 dose.    Historical Provider, MD  ondansetron (ZOFRAN) 4 MG tablet Take 1 tablet (4 mg total) by mouth every 6 (six) hours. Prn n/v. 07/28/14   Linna HoffJames D Keana Dueitt, MD  predniSONE (DELTASONE) 10 MG tablet Take 3 tablets (30 mg total) by mouth daily. 07/06/14   Rodolph BongEvan S Corey, MD  zolpidem (AMBIEN) 10 MG tablet TAKE ONE TABLET BY MOUTH AT BEDTIME AS NEEDED 01/02/12   Kirkland HunArthur Stringer, MD   BP 130/89 mmHg  Pulse 81  Temp(Src) 98.6 F (37 C) (Oral)  Resp 16  SpO2 100% Physical Exam  Constitutional: She is oriented to person, place, and time. She appears well-developed and  well-nourished. No distress.  Eyes: Conjunctivae are normal. Pupils are equal, round, and reactive to light.  Neck: Normal range of motion. Neck supple.  Cardiovascular: Normal rate and normal heart sounds.   Pulmonary/Chest: Effort normal. She has no wheezes. She has rhonchi. She has no rales.  Abdominal: Soft. Bowel sounds are normal. She exhibits no distension and no mass. There is no tenderness. There is no rebound and no guarding.  Lymphadenopathy:    She has cervical adenopathy.  Neurological: She is alert and oriented to person, place, and time.  Skin: Skin is warm and dry.  Nursing note and vitals reviewed.   ED Course  Procedures (including critical care time) Labs Review Labs Reviewed  POCT URINALYSIS DIP (DEVICE) - Abnormal; Notable for the following:    Leukocytes, UA TRACE (*)    All other components within normal limits    Imaging Review No results found.   MDM   1. Acute bronchitis due to other specified organisms   2. Gastroenteritis, acute        Linna HoffJames D Madelin Weseman, MD 07/28/14 [id number]

## 2014-11-14 ENCOUNTER — Emergency Department (HOSPITAL_COMMUNITY)
Admission: EM | Admit: 2014-11-14 | Discharge: 2014-11-14 | Disposition: A | Discharge status: Home / Self Care | Payer: 59 | Source: Home / Self Care | Attending: Emergency Medicine | Admitting: Emergency Medicine

## 2014-11-14 ENCOUNTER — Encounter (HOSPITAL_COMMUNITY): Payer: Self-pay

## 2014-11-14 DIAGNOSIS — R1030 Lower abdominal pain, unspecified: Secondary | ICD-10-CM | POA: Diagnosis not present

## 2014-11-14 DIAGNOSIS — K5901 Slow transit constipation: Secondary | ICD-10-CM

## 2014-11-14 LAB — POCT URINALYSIS DIP (DEVICE)
Bilirubin Urine: NEGATIVE
GLUCOSE, UA: NEGATIVE mg/dL
HGB URINE DIPSTICK: NEGATIVE
Ketones, ur: NEGATIVE mg/dL
LEUKOCYTES UA: NEGATIVE
Nitrite: NEGATIVE
PH: 6 (ref 5.0–8.0)
Protein, ur: NEGATIVE mg/dL
Specific Gravity, Urine: 1.025 (ref 1.005–1.030)
UROBILINOGEN UA: 0.2 mg/dL (ref 0.0–1.0)

## 2014-11-14 NOTE — Discharge Instructions (Signed)
Constipation Miralax as directed.  Follow recommendations below Constipation is when a person has fewer than three bowel movements a week, has difficulty having a bowel movement, or has stools that are dry, hard, or larger than normal. As people grow older, constipation is more common. If you try to fix constipation with medicines that make you have a bowel movement (laxatives), the problem may get worse. Long-term laxative use may cause the muscles of the colon to become weak. A low-fiber diet, not taking in enough fluids, and taking certain medicines may make constipation worse.  CAUSES   Certain medicines, such as antidepressants, pain medicine, iron supplements, antacids, and water pills.   Certain diseases, such as diabetes, irritable bowel syndrome (IBS), thyroid disease, or depression.   Not drinking enough water.   Not eating enough fiber-rich foods.   Stress or travel.   Lack of physical activity or exercise.   Ignoring the urge to have a bowel movement.   Using laxatives too much.  SIGNS AND SYMPTOMS   Having fewer than three bowel movements a week.   Straining to have a bowel movement.   Having stools that are hard, dry, or larger than normal.   Feeling full or bloated.   Pain in the lower abdomen.   Not feeling relief after having a bowel movement.  DIAGNOSIS  Your health care provider will take a medical history and perform a physical exam. Further testing may be done for severe constipation. Some tests may include:  A barium enema X-ray to examine your rectum, colon, and, sometimes, your small intestine.   A sigmoidoscopy to examine your lower colon.   A colonoscopy to examine your entire colon. TREATMENT  Treatment will depend on the severity of your constipation and what is causing it. Some dietary treatments include drinking more fluids and eating more fiber-rich foods. Lifestyle treatments may include regular exercise. If these diet and  lifestyle recommendations do not help, your health care provider may recommend taking over-the-counter laxative medicines to help you have bowel movements. Prescription medicines may be prescribed if over-the-counter medicines do not work.  HOME CARE INSTRUCTIONS   Eat foods that have a lot of fiber, such as fruits, vegetables, whole grains, and beans.  Limit foods high in fat and processed sugars, such as french fries, hamburgers, cookies, candies, and soda.   A fiber supplement may be added to your diet if you cannot get enough fiber from foods.   Drink enough fluids to keep your urine clear or pale yellow.   Exercise regularly or as directed by your health care provider.   Go to the restroom when you have the urge to go. Do not hold it.   Only take over-the-counter or prescription medicines as directed by your health care provider. Do not take other medicines for constipation without talking to your health care provider first.  SEEK IMMEDIATE MEDICAL CARE IF:   You have bright red blood in your stool.   Your constipation lasts for more than 4 days or gets worse.   You have abdominal or rectal pain.   You have thin, pencil-like stools.   You have unexplained weight loss. MAKE SURE YOU:   Understand these instructions.  Will watch your condition.  Will get help right away if you are not doing well or get worse. Document Released: 04/14/2004 Document Revised: 07/22/2013 Document Reviewed: 04/28/2013 [facility] Patient Information 2015 [place], [place]. This information is not intended to replace advice given to you by your health care  provider. Make sure you discuss any questions you have with your health care provider.  Abdominal Pain Many things can cause abdominal pain. Usually, abdominal pain is not caused by a disease and will improve without treatment. It can often be observed and treated at home. Your health care provider will do a physical exam and possibly order  blood tests and X-rays to help determine the seriousness of your pain. However, in many cases, more time must pass before a clear cause of the pain can be found. Before that point, your health care provider may not know if you need more testing or further treatment. HOME CARE INSTRUCTIONS  Monitor your abdominal pain for any changes. The following actions may help to alleviate any discomfort you are experiencing:  Only take over-the-counter or prescription medicines as directed by your health care provider.  Do not take laxatives unless directed to do so by your health care provider.  Try a clear liquid diet (broth, tea, or water) as directed by your health care provider. Slowly move to a bland diet as tolerated. SEEK MEDICAL CARE IF:  You have unexplained abdominal pain.  You have abdominal pain associated with nausea or diarrhea.  You have pain when you urinate or have a bowel movement.  You experience abdominal pain that wakes you in the night.  You have abdominal pain that is worsened or improved by eating food.  You have abdominal pain that is worsened with eating fatty foods.  You have a fever. SEEK IMMEDIATE MEDICAL CARE IF:   Your pain does not go away within 2 hours.  You keep throwing up (vomiting).  Your pain is felt only in portions of the abdomen, such as the right side or the left lower portion of the abdomen.  You pass bloody or black tarry stools. MAKE SURE YOU:  Understand these instructions.   Will watch your condition.   Will get help right away if you are not doing well or get worse.  Document Released: 04/26/2005 Document Revised: 07/22/2013 Document Reviewed: 03/26/2013 [facility] Patient Information 2015 [place], [place]. This information is not intended to replace advice given to you by your health care provider. Make sure you discuss any questions you have with your health care provider.

## 2014-11-14 NOTE — ED Provider Notes (Signed)
CSN: [id number]     Arrival date & time 11/14/14  1151 History   First MD Initiated Contact with Patient 11/14/14 1236     Chief Complaint  Patient presents with  . Flank Pain   (Consider location/radiation/quality/duration/timing/severity/associated sxs/prior Treatment) HPI Comments: 54 year old female complaining of a bloated abdomen with tenderness primarily to the left hemiabdomen radiating to the left back for 2 days. She has had an occasional nausea and decreased appetite. She states her bowel movements have been normal and she seen no blood. She denies dysuria, frequency and urgency. She believes she has another kidney infection.   Past Medical History  Diagnosis Date  . Sinusitis   . Acid reflux   . Prolapse of mitral valve    Past Surgical History  Procedure Laterality Date  . Appendectomy    . Abdominal hysterectomy    . Tonsillectomy    . Bunionectomy     Family History  Problem Relation Age of Onset  . Stroke Mother   . Heart attack Father   . Hypertension Father   . Diabetes Father    History  Substance Use Topics  . Smoking status: Current Every Day Smoker -- 0.10 packs/day    Types: Cigarettes    Last Attempt to Quit: 06/11/2011  . Smokeless tobacco: Former NeurosurgeonUser    Quit date: 06/11/2011  . Alcohol Use: Yes     Comment: socially   OB History    No data available     Review of Systems  Constitutional: Negative for fever, activity change and fatigue.  HENT: Negative.   Respiratory: Negative.   Cardiovascular: Negative.   Gastrointestinal: Positive for nausea, abdominal pain and abdominal distention. Negative for vomiting, diarrhea, blood in stool and rectal pain.  Genitourinary: Positive for flank pain. Negative for dysuria, urgency, frequency, hematuria, decreased urine volume, vaginal discharge and pelvic pain.  Musculoskeletal: Negative.   Neurological: Negative.     Allergies  Review of patient's allergies indicates no known allergies.  Home  Medications   Prior to Admission medications   Medication Sig Start Date End Date Taking? Authorizing Provider  ESTROGENS CONJUGATED PO Take by mouth.    Historical Provider, MD  fluticasone (FLONASE) 50 MCG/ACT nasal spray Place 2 sprays into the nose daily. 09/11/11 09/10/12  Linna HoffJames D Kindl, MD  hydrOXYzine (ATARAX/VISTARIL) 25 MG tablet Take 1 tablet (25 mg total) by mouth every 8 (eight) hours as needed for itching. 12/16/12   Jimmie MollyPaolo Coll, MD  ipratropium (ATROVENT) 0.06 % nasal spray Place 2 sprays into both nostrils 4 (four) times daily. 07/06/14   Rodolph BongEvan S Corey, MD  Levocetirizine Dihydrochloride (XYZAL PO) Take by mouth 1 day or 1 dose.    Historical Provider, MD  OMEPRAZOLE PO Take by mouth 1 day or 1 dose.    Historical Provider, MD  ondansetron (ZOFRAN) 4 MG tablet Take 1 tablet (4 mg total) by mouth every 6 (six) hours. Prn n/v. 07/28/14   Linna HoffJames D Kindl, MD  zolpidem (AMBIEN) 10 MG tablet TAKE ONE TABLET BY MOUTH AT BEDTIME AS NEEDED 01/02/12   Kirkland HunArthur Stringer, MD   BP 147/95 mmHg  Pulse 80  Temp(Src) 98 F (36.7 C) (Oral)  Resp 16  SpO2 99% Physical Exam  Constitutional: She is oriented to person, place, and time. She appears well-developed and well-nourished. No distress.  Neck: Normal range of motion. Neck supple.  Cardiovascular: Normal rate and regular rhythm.   Pulmonary/Chest: Effort normal and breath sounds normal. No respiratory distress. She  has no wheezes. She has no rales.  Abdominal: Soft. Bowel sounds are normal. She exhibits no distension and no mass. There is no rebound and no guarding.  Minor tenderness in the left upper quadrant. Left hemiabdomen and lower abdomen percusses flat.   Musculoskeletal: Normal range of motion.  Neurological: She is alert and oriented to person, place, and time.  Skin: Skin is warm.  Psychiatric: She has a normal mood and affect.  Nursing note and vitals reviewed.   ED Course  Procedures (including critical care time) Labs  Review Labs Reviewed  POCT URINALYSIS DIP (DEVICE)    Imaging Review No results found.   MDM   1. Lower abdominal pain   2. Slow transit constipation    Miralax as directed.  Follow recommendations below For worsening, increased pain, vomiting, bleeding go to the ED.   Hayden Rasmussen, NP 11/14/14 [phone]

## 2014-11-14 NOTE — ED Notes (Signed)
"   I think I have another kidney infection". Symptoms since Thursday

## 2019-10-05 ENCOUNTER — Ambulatory Visit: Payer: 59 | Attending: Internal Medicine

## 2019-10-05 DIAGNOSIS — Z23 Encounter for immunization: Secondary | ICD-10-CM

## 2019-10-05 NOTE — Progress Notes (Signed)
   Covid-19 Vaccination Clinic  Name:  MYKIAH SCHMUCK    MRN: [id number] DOB: 05/26/1961  10/05/2019  Ms. Skarda was observed post Covid-19 immunization for 15 minutes without incident. She was provided with Vaccine Information Sheet and instruction to access the V-Safe system.   Ms. Sillas was instructed to call 911 with any severe reactions post vaccine: Marland Kitchen Difficulty breathing  . Swelling of face and throat  . A fast heartbeat  . A bad rash all over body  . Dizziness and weakness   Immunizations Administered    Name Date Dose VIS Date Route   Pfizer COVID-19 Vaccine 10/05/2019  4:15 PM 0.3 mL 07/11/2019 Intranasal   Manufacturer: [employer], [employer]   Lot: [id number]   NDC: [address]-0

## 2019-11-05 ENCOUNTER — Ambulatory Visit: Payer: 59 | Attending: Internal Medicine

## 2019-11-05 DIAGNOSIS — Z23 Encounter for immunization: Secondary | ICD-10-CM

## 2019-11-05 NOTE — Progress Notes (Signed)
   Covid-19 Vaccination Clinic  Name:  Cynthia Palmer    MRN: [id number] DOB: [birth year]  11/05/2019  [name] was observed post Covid-19 immunization for 15 minutes without incident. She was provided with Vaccine Information Sheet and instruction to access the V-Safe system.   [name] was instructed to call 911 with any severe reactions post vaccine: Marland Kitchen Difficulty breathing  . Swelling of face and throat  . A fast heartbeat  . A bad rash all over body  . Dizziness and weakness   Immunizations Administered    Name Date Dose VIS Date Route   Pfizer COVID-19 Vaccine 11/05/2019  4:15 PM 0.3 mL 07/11/2019 Intramuscular   Manufacturer: [employer], [employer]   Lot: [id number]   NDC: [address]-3

## 2019-11-09 ENCOUNTER — Other Ambulatory Visit: Payer: Self-pay

## 2019-11-09 ENCOUNTER — Ambulatory Visit
Admission: EM | Admit: 2019-11-09 | Discharge: 2019-11-09 | Disposition: A | Discharge status: Home / Self Care | Payer: 59 | Attending: Emergency Medicine | Admitting: Emergency Medicine

## 2019-11-09 ENCOUNTER — Encounter: Payer: Self-pay | Admitting: Emergency Medicine

## 2019-11-09 DIAGNOSIS — W57XXXA Bitten or stung by nonvenomous insect and other nonvenomous arthropods, initial encounter: Secondary | ICD-10-CM | POA: Diagnosis not present

## 2019-11-09 DIAGNOSIS — I160 Hypertensive urgency: Secondary | ICD-10-CM

## 2019-11-09 MED ORDER — TRIAMCINOLONE ACETONIDE 0.5 % EX OINT: 1.0000 "application " | TOPICAL_OINTMENT | Freq: Two times a day (BID) | CUTANEOUS | 0 refills | Status: AC

## 2019-11-09 MED ORDER — DIPHENHYDRAMINE HCL 25 MG PO TABS: 25.0000 mg | ORAL_TABLET | Freq: Four times a day (QID) | ORAL | 0 refills | Status: AC | PRN

## 2019-11-09 MED ORDER — PREDNISONE 20 MG PO TABS: 20.0000 mg | ORAL_TABLET | Freq: Every day | ORAL | 0 refills | Status: DC

## 2019-11-09 NOTE — ED Triage Notes (Signed)
Patient has insect bites.  Patient has places on hands, arms, (2 on back)  Symptoms for 2 weeks Areas itch, visible red , swollen areas.  None on legs.

## 2019-11-09 NOTE — ED Provider Notes (Signed)
[facility]    CSN: [id number] Arrival date & time: 11/09/19  1514      History   Chief Complaint Chief Complaint  Patient presents with  . Insect Bite    HPI Cynthia Palmer is a 59 y.o. female presenting for insect bites.  Patient states she has noticed this issue off and on for the last 4 weeks.  States that spots have been changing in location will swell up and become red and itchy.  Denies tenderness, fever, arthralgias, myalgias.  Has not tried thing for this.  Lives with her mother who has not had any lesions.  Patient denies change in detergent, bedding, soaps.    Past Medical History:  Diagnosis Date  . Acid reflux   . Prolapse of mitral valve   . Sinusitis     There are no problems to display for this patient.   Past Surgical History:  Procedure Laterality Date  . ABDOMINAL HYSTERECTOMY    . APPENDECTOMY    . BUNIONECTOMY    . TONSILLECTOMY      OB History   No obstetric history on file.      Home Medications    Prior to Admission medications   Medication Sig Start Date End Date Taking? Authorizing Provider  diphenhydrAMINE (BENADRYL) 25 MG tablet Take 1 tablet (25 mg total) by mouth every 6 (six) hours as needed. 11/09/19   Hall-Potvin, [place], PA-C  ESTROGENS CONJUGATED PO Take by mouth.    [provider]  fluticasone (FLONASE) 50 MCG/ACT nasal spray Place 2 sprays into the nose daily. 09/11/11 09/10/12  Billy Fischer, MD  hydrOXYzine (ATARAX/VISTARIL) 25 MG tablet Take 1 tablet (25 mg total) by mouth every 8 (eight) hours as needed for itching. 12/16/12   Rosana Hoes, MD  ipratropium (ATROVENT) 0.06 % nasal spray Place 2 sprays into both nostrils 4 (four) times daily. 07/06/14   Gregor Hams, MD  Levocetirizine Dihydrochloride (XYZAL PO) Take by mouth 1 day or 1 dose.    [provider]  OMEPRAZOLE PO Take by mouth 1 day or 1 dose.    [provider]  ondansetron (ZOFRAN) 4 MG tablet Take 1 tablet (4 mg  total) by mouth every 6 (six) hours. Prn n/v. 07/28/14   Billy Fischer, MD  predniSONE (DELTASONE) 20 MG tablet Take 1 tablet (20 mg total) by mouth daily. 11/09/19   Hall-Potvin, [place], PA-C  triamcinolone ointment (KENALOG) 0.5 % Apply 1 application topically 2 (two) times daily. 11/09/19   Hall-Potvin, [place], PA-C  zolpidem (AMBIEN) 10 MG tablet TAKE ONE TABLET BY MOUTH AT BEDTIME AS NEEDED 01/02/12   Ena Dawley, MD    Family History Family History  Problem Relation Age of Onset  . Stroke Mother   . Heart attack Father   . Hypertension Father   . Diabetes Father     Social History Social History   Tobacco Use  . Smoking status: Current Every Day Smoker    Packs/day: 0.10    Types: Cigarettes    Last attempt to quit: 06/11/2011    Years since quitting: 8.4  . Smokeless tobacco: Former Systems developer    Quit date: 06/11/2011  Substance Use Topics  . Alcohol use: Yes    Comment: socially  . Drug use: No     Allergies   Patient has no known allergies.   Review of Systems As per HPI   Physical Exam Triage Vital Signs ED Triage Vitals  Enc Vitals  Group     BP 11/09/19 1532 (!) 165/125     Pulse Rate 11/09/19 1532 96     Resp 11/09/19 1532 18     Temp 11/09/19 1532 98.5 F (36.9 C)     Temp Source 11/09/19 1532 Oral     SpO2 11/09/19 1532 98 %     Weight --      Height --      Head Circumference --      Peak Flow --      Pain Score 11/09/19 1529 0     Pain Loc --      Pain Edu? --      Excl. in GC? --    No data found.  Updated Vital Signs BP (!) 165/125 (BP Location: Left Arm)   Pulse 96   Temp 98.5 F (36.9 C) (Oral)   Resp 18   SpO2 98%   Visual Acuity Right Eye Distance:   Left Eye Distance:   Bilateral Distance:    Right Eye Near:   Left Eye Near:    Bilateral Near:     Physical Exam Constitutional:      General: She is not in acute distress. HENT:     Head: Normocephalic and atraumatic.  Eyes:     General: No scleral icterus.     Pupils: Pupils are equal, round, and reactive to light.  Cardiovascular:     Rate and Rhythm: Normal rate.  Pulmonary:     Effort: Pulmonary effort is normal.  Skin:    Coloration: Skin is not jaundiced or pale.     Comments: Few scattered areas of erythema with central punctate on left arm, right arm, back.  Most significant is left arm.  Warm without tenderness or induration.  No active discharge or streaking.  Neurological:     Mental Status: She is alert and oriented to person, place, and time.      UC Treatments / Results  Labs (all labs ordered are listed, but only abnormal results are displayed) Labs Reviewed - No data to display  EKG   Radiology No results found.  Procedures Procedures (including critical care time)  Medications Ordered in UC Medications - No data to display  Initial Impression / Assessment and Plan / UC Course  I have reviewed the triage vital signs and the nursing notes.  Pertinent labs & imaging results that were available during my care of the patient were reviewed by me and considered in my medical decision making (see chart for details).     Patient afebrile, nontoxic in office today.  Patient does have elevated blood pressure from baseline: Patient attributes this to stress" dealing with this ".  Patient is routinely followed by her primary care and currently denying symptoms of endorgan damage.  Will defer to PCP for further evaluation/management.  In the interim, will trial prednisone, Benadryl, triamcinolone.  Return precautions discussed, patient verbalized understanding and is agreeable to plan. Final Clinical Impressions(s) / UC Diagnoses   Final diagnoses:  Bug bite, initial encounter  Hypertensive urgency     Discharge Instructions     Take prednisone every morning with food. Take Benadryl before bed. May apply triamcinolone cream to areas that are particularly itchy. Very important to follow-up with your primary care  regarding elevated blood pressure reading today. Return for worsening pain, swelling, itching, fever.    ED Prescriptions    Medication Sig Dispense Auth. Provider   predniSONE (DELTASONE) 20 MG tablet Take 1  tablet (20 mg total) by mouth daily. 5 tablet Hall-Potvin, [place], PA-C   diphenhydrAMINE (BENADRYL) 25 MG tablet Take 1 tablet (25 mg total) by mouth every 6 (six) hours as needed. 30 tablet Hall-Potvin, [place], PA-C   triamcinolone ointment (KENALOG) 0.5 % Apply 1 application topically 2 (two) times daily. 30 g Hall-Potvin, [place], PA-C     PDMP not reviewed this encounter.   Hall-Potvin, [place], [place] 11/09/19 1555

## 2019-11-09 NOTE — Discharge Instructions (Addendum)
Take prednisone every morning with food. Take Benadryl before bed. May apply triamcinolone cream to areas that are particularly itchy. Very important to follow-up with your primary care regarding elevated blood pressure reading today. Return for worsening pain, swelling, itching, fever.

## 2020-01-24 ENCOUNTER — Observation Stay (HOSPITAL_COMMUNITY)
Admission: EM | Admit: 2020-01-24 | Discharge: 2020-01-25 | Disposition: A | Discharge status: Home / Self Care | Payer: 59 | Attending: Internal Medicine | Admitting: Internal Medicine

## 2020-01-24 ENCOUNTER — Encounter (HOSPITAL_COMMUNITY): Payer: Self-pay | Admitting: Internal Medicine

## 2020-01-24 ENCOUNTER — Observation Stay (HOSPITAL_COMMUNITY): Payer: 59

## 2020-01-24 ENCOUNTER — Other Ambulatory Visit: Payer: Self-pay

## 2020-01-24 ENCOUNTER — Emergency Department (HOSPITAL_COMMUNITY): Payer: 59

## 2020-01-24 DIAGNOSIS — I10 Essential (primary) hypertension: Secondary | ICD-10-CM

## 2020-01-24 DIAGNOSIS — Z20822 Contact with and (suspected) exposure to covid-19: Secondary | ICD-10-CM | POA: Diagnosis not present

## 2020-01-24 DIAGNOSIS — Z79899 Other long term (current) drug therapy: Secondary | ICD-10-CM | POA: Diagnosis not present

## 2020-01-24 DIAGNOSIS — K56600 Partial intestinal obstruction, unspecified as to cause: Secondary | ICD-10-CM | POA: Diagnosis not present

## 2020-01-24 DIAGNOSIS — F17210 Nicotine dependence, cigarettes, uncomplicated: Secondary | ICD-10-CM | POA: Insufficient documentation

## 2020-01-24 DIAGNOSIS — K56609 Unspecified intestinal obstruction, unspecified as to partial versus complete obstruction: Secondary | ICD-10-CM | POA: Diagnosis present

## 2020-01-24 DIAGNOSIS — Z0189 Encounter for other specified special examinations: Secondary | ICD-10-CM | POA: Diagnosis present

## 2020-01-24 DIAGNOSIS — K219 Gastro-esophageal reflux disease without esophagitis: Secondary | ICD-10-CM | POA: Diagnosis not present

## 2020-01-24 DIAGNOSIS — Z833 Family history of diabetes mellitus: Secondary | ICD-10-CM | POA: Diagnosis not present

## 2020-01-24 DIAGNOSIS — Z8249 Family history of ischemic heart disease and other diseases of the circulatory system: Secondary | ICD-10-CM | POA: Diagnosis not present

## 2020-01-24 DIAGNOSIS — Z7952 Long term (current) use of systemic steroids: Secondary | ICD-10-CM | POA: Insufficient documentation

## 2020-01-24 LAB — URINALYSIS, ROUTINE W REFLEX MICROSCOPIC
Bilirubin Urine: NEGATIVE
Glucose, UA: NEGATIVE mg/dL
Hgb urine dipstick: NEGATIVE
Ketones, ur: 5 mg/dL — AB
Leukocytes,Ua: NEGATIVE
Nitrite: NEGATIVE
Protein, ur: 30 mg/dL — AB
Specific Gravity, Urine: 1.017 (ref 1.005–1.030)
pH: 8 (ref 5.0–8.0)

## 2020-01-24 LAB — COMPREHENSIVE METABOLIC PANEL
ALT: 14 U/L (ref 0–44)
AST: 19 U/L (ref 15–41)
Albumin: 4.2 g/dL (ref 3.5–5.0)
Alkaline Phosphatase: 58 U/L (ref 38–126)
Anion gap: 15 (ref 5–15)
BUN: 10 mg/dL (ref 6–20)
CO2: 30 mmol/L (ref 22–32)
Calcium: 9.7 mg/dL (ref 8.9–10.3)
Chloride: 94 mmol/L — ABNORMAL LOW (ref 98–111)
Creatinine, Ser: 0.86 mg/dL (ref 0.44–1.00)
GFR calc Af Amer: >60 mL/min (ref >60)
GFR calc non Af Amer: >60 mL/min (ref >60)
Glucose, Bld: 128 mg/dL — ABNORMAL HIGH (ref 70–99)
Potassium: 3.1 mmol/L — ABNORMAL LOW (ref 3.5–5.1)
Sodium: 139 mmol/L (ref 135–145)
Total Bilirubin: 0.6 mg/dL (ref 0.3–1.2)
Total Protein: 7.5 g/dL (ref 6.5–8.1)

## 2020-01-24 LAB — CBC
HCT: 42.7 % (ref 36.0–46.0)
Hemoglobin: 14.1 g/dL (ref 12.0–15.0)
MCH: 29 pg (ref 26.0–34.0)
MCHC: 33 g/dL (ref 30.0–36.0)
MCV: 87.9 fL (ref 80.0–100.0)
Platelets: 299 10*3/uL (ref 150–400)
RBC: 4.86 MIL/uL (ref 3.87–5.11)
RDW: 13.8 % (ref 11.5–15.5)
WBC: 10.2 10*3/uL (ref 4.0–10.5)
nRBC: 0 % (ref 0.0–0.2)

## 2020-01-24 LAB — LIPASE, BLOOD: Lipase: 27 U/L (ref 11–51)

## 2020-01-24 LAB — SARS CORONAVIRUS 2 BY RT PCR (HOSPITAL ORDER, PERFORMED IN ~~LOC~~ HOSPITAL LAB): SARS Coronavirus 2: NEGATIVE

## 2020-01-24 MED ORDER — HYDROMORPHONE HCL 1 MG/ML IJ SOLN
0.5000 mg | Freq: Once | INTRAMUSCULAR | Status: AC
  Administered 2020-01-24: 0.5 mg via INTRAVENOUS
  Filled 2020-01-24: qty 1

## 2020-01-24 MED ORDER — ENOXAPARIN SODIUM 40 MG/0.4ML ~~LOC~~ SOLN
40.0000 mg | Freq: Every day | SUBCUTANEOUS | Status: DC
  Administered 2020-01-24: 40 mg via SUBCUTANEOUS
  Filled 2020-01-24: qty 0.4

## 2020-01-24 MED ORDER — DIPHENHYDRAMINE HCL 50 MG/ML IJ SOLN
12.5000 mg | Freq: Once | INTRAMUSCULAR | Status: AC
  Administered 2020-01-24: 12.5 mg via INTRAVENOUS
  Filled 2020-01-24: qty 1

## 2020-01-24 MED ORDER — ONDANSETRON HCL 4 MG/2ML IJ SOLN: 4.0000 mg | Freq: Four times a day (QID) | INTRAMUSCULAR | Status: DC | PRN

## 2020-01-24 MED ORDER — ACETAMINOPHEN 325 MG PO TABS
650.0000 mg | ORAL_TABLET | Freq: Four times a day (QID) | ORAL | Status: DC | PRN
  Administered 2020-01-24: 650 mg via ORAL
  Filled 2020-01-24: qty 2

## 2020-01-24 MED ORDER — SODIUM CHLORIDE 0.9 % IV BOLUS
1000.0000 mL | Freq: Once | INTRAVENOUS | Status: AC
  Administered 2020-01-24: 1000 mL via INTRAVENOUS

## 2020-01-24 MED ORDER — IOHEXOL 300 MG/ML  SOLN
100.0000 mL | Freq: Once | INTRAMUSCULAR | Status: AC | PRN
  Administered 2020-01-24: 100 mL via INTRAVENOUS

## 2020-01-24 MED ORDER — ACETAMINOPHEN 650 MG RE SUPP: 650.0000 mg | Freq: Four times a day (QID) | RECTAL | Status: DC | PRN

## 2020-01-24 MED ORDER — SODIUM CHLORIDE 0.9% FLUSH
3.0000 mL | Freq: Once | INTRAVENOUS | Status: AC
  Administered 2020-01-24: 3 mL via INTRAVENOUS

## 2020-01-24 MED ORDER — HYDROMORPHONE HCL 1 MG/ML IJ SOLN: 0.5000 mg | INTRAMUSCULAR | Status: DC | PRN

## 2020-01-24 MED ORDER — KETOROLAC TROMETHAMINE 30 MG/ML IJ SOLN
30.0000 mg | Freq: Four times a day (QID) | INTRAMUSCULAR | Status: DC | PRN
  Administered 2020-01-24: 30 mg via INTRAVENOUS
  Filled 2020-01-24: qty 1

## 2020-01-24 MED ORDER — POTASSIUM CHLORIDE 10 MEQ/100ML IV SOLN
10.0000 meq | INTRAVENOUS | Status: AC
  Administered 2020-01-24 (×2): 10 meq via INTRAVENOUS
  Filled 2020-01-24 (×2): qty 100

## 2020-01-24 MED ORDER — LACTATED RINGERS IV SOLN: INTRAVENOUS | Status: DC

## 2020-01-24 MED ORDER — DIATRIZOATE MEGLUMINE & SODIUM 66-10 % PO SOLN
90.0000 mL | Freq: Once | ORAL | Status: AC
  Administered 2020-01-24: 90 mL via NASOGASTRIC
  Filled 2020-01-24 (×2): qty 90

## 2020-01-24 NOTE — ED Notes (Signed)
Report given to 6n

## 2020-01-24 NOTE — ED Provider Notes (Signed)
[facility] EMERGENCY DEPARTMENT Provider Note   CSN: [id number] Arrival date & time: 01/24/20  0043     History Chief Complaint  Patient presents with  . Abdominal Pain    Cynthia Palmer is a 59 y.o. female with history of HTN who presents with abdominal pain.  Patient states that yesterday around 11 AM she started to have abdominal cramping that was very deep.  He states that it comes and goes randomly.  Nothing makes it better or worse.  Her last bowel movement was in the morning.  She has not had a bowel movement or passed gas since yesterday morning.  She last ate raisin bran and an apple around 12PM and then last night she had several episodes of nausea and vomiting which were food contents.  She states that her abdomen feels swollen.  Feels similar to this when she has had a bowel blockage in the past which required surgical intervention.  Past surgical history significant for appendectomy, hysterectomy, and ex-lap for lysis of adhesions in 2011 by Dr. Janee Morn.  She denies fever or chills but feels like she has an upper respiratory infection with nasal congestion, sore throat, and a mild cough.  She denies chest pain or shortness of breath.  HPI   Past Medical History:  Diagnosis Date  . Acid reflux   . Prolapse of mitral valve   . Sinusitis     There are no problems to display for this patient.   Past Surgical History:  Procedure Laterality Date  . ABDOMINAL HYSTERECTOMY    . APPENDECTOMY    . BUNIONECTOMY    . TONSILLECTOMY       OB History   No obstetric history on file.     Family History  Problem Relation Age of Onset  . Stroke Mother   . Heart attack Father   . Hypertension Father   . Diabetes Father     Social History   Tobacco Use  . Smoking status: Current Every Day Smoker    Packs/day: 0.10    Types: Cigarettes    Last attempt to quit: 06/11/2011    Years since quitting: 8.6  . Smokeless tobacco: Former Neurosurgeon    Quit  date: 06/11/2011  Substance Use Topics  . Alcohol use: Yes    Comment: socially  . Drug use: No    Home Medications Prior to Admission medications   Medication Sig Start Date End Date Taking? Authorizing Provider  diphenhydrAMINE (BENADRYL) 25 MG tablet Take 1 tablet (25 mg total) by mouth every 6 (six) hours as needed. 11/09/19   Hall-Potvin, [place], PA-C  ESTROGENS CONJUGATED PO Take by mouth.    [provider]  fluticasone (FLONASE) 50 MCG/ACT nasal spray Place 2 sprays into the nose daily. 09/11/11 09/10/12  Linna Hoff, MD  hydrOXYzine (ATARAX/VISTARIL) 25 MG tablet Take 1 tablet (25 mg total) by mouth every 8 (eight) hours as needed for itching. 12/16/12   Jimmie Molly, MD  ipratropium (ATROVENT) 0.06 % nasal spray Place 2 sprays into both nostrils 4 (four) times daily. 07/06/14   Rodolph Bong, MD  Levocetirizine Dihydrochloride (XYZAL PO) Take by mouth 1 day or 1 dose.    [provider]  OMEPRAZOLE PO Take by mouth 1 day or 1 dose.    [provider]  ondansetron (ZOFRAN) 4 MG tablet Take 1 tablet (4 mg total) by mouth every 6 (six) hours. Prn n/v. 07/28/14   Linna Hoff, MD  predniSONE (DELTASONE) 20 MG tablet Take 1 tablet (20 mg total) by mouth daily. 11/09/19   Hall-Potvin, [place], PA-C  triamcinolone ointment (KENALOG) 0.5 % Apply 1 application topically 2 (two) times daily. 11/09/19   Hall-Potvin, [place], PA-C  zolpidem (AMBIEN) 10 MG tablet TAKE ONE TABLET BY MOUTH AT BEDTIME AS NEEDED 01/02/12   Kirkland Hun, MD    Allergies    Patient has no known allergies.  Review of Systems   Review of Systems  Constitutional: Negative for chills and fever.  HENT: Positive for congestion and rhinorrhea.   Respiratory: Positive for cough. Negative for shortness of breath.   Cardiovascular: Negative for chest pain.  Gastrointestinal: Positive for abdominal distention, abdominal pain, nausea and vomiting. Negative for constipation and diarrhea.    Genitourinary: Negative for dysuria and flank pain.  All other systems reviewed and are negative.   Physical Exam Updated Vital Signs BP (!) 124/97 (BP Location: Left Arm)   Pulse 96   Temp 98.9 F (37.2 C) (Oral)   Resp 14   Ht 5\' 5"  (1.651 m)   Wt 70.3 kg   SpO2 93%   BMI 25.79 kg/m   Physical Exam Vitals and nursing note reviewed.  Constitutional:      General: She is not in acute distress.    Appearance: Normal appearance. She is well-developed. She is not ill-appearing.  HENT:     Head: Normocephalic and atraumatic.  Eyes:     General: No scleral icterus.       Right eye: No discharge.        Left eye: No discharge.     Conjunctiva/sclera: Conjunctivae normal.     Pupils: Pupils are equal, round, and reactive to light.  Cardiovascular:     Rate and Rhythm: Normal rate and regular rhythm.  Pulmonary:     Effort: Pulmonary effort is normal. No respiratory distress.     Breath sounds: Normal breath sounds.  Abdominal:     General: There is no distension.     Palpations: Abdomen is soft.     Tenderness: There is no abdominal tenderness.     Comments: Well healed lower mid line scar  Musculoskeletal:     Cervical back: Normal range of motion.  Skin:    General: Skin is warm and dry.  Neurological:     Mental Status: She is alert and oriented to person, place, and time.  Psychiatric:        Behavior: Behavior normal.     ED Results / Procedures / Treatments   Labs (all labs ordered are listed, but only abnormal results are displayed) Labs Reviewed  COMPREHENSIVE METABOLIC PANEL - Abnormal; Notable for the following components:      Result Value   Potassium 3.1 (*)    Chloride 94 (*)    Glucose, Bld 128 (*)    All other components within normal limits  URINALYSIS, ROUTINE W REFLEX MICROSCOPIC - Abnormal; Notable for the following components:   APPearance HAZY (*)    Ketones, ur 5 (*)    Protein, ur 30 (*)    Bacteria, UA RARE (*)    All other  components within normal limits  SARS CORONAVIRUS 2 BY RT PCR (HOSPITAL ORDER, PERFORMED IN [facility])  LIPASE, BLOOD  CBC    EKG None  Radiology CT Abdomen Pelvis W Contrast  Result Date: 01/24/2020 CLINICAL DATA:  Abdominal pain. Patient concerned for bowel obstruction. EXAM: CT ABDOMEN AND PELVIS WITH CONTRAST TECHNIQUE:  Multidetector CT imaging of the abdomen and pelvis was performed using the standard protocol following bolus administration of intravenous contrast. CONTRAST:  OMNIPAQUE IOHEXOL 300 MG/ML  SOLN COMPARISON:  09/26/2009 FINDINGS: Lower chest: No acute abnormality. Hepatobiliary: Normal liver size and overall attenuation. Subcentimeter low-density lesion, anterolateral segment of the left lobe, tiny low-density lesion centrally and small area of low attenuation adjacent to a portal branch in the inferior right lobe, not fully characterized but likely either cysts or small hemangiomas. No other liver abnormality. Normal gallbladder. No bile duct dilation. Pancreas: Unremarkable. No pancreatic ductal dilatation or surrounding inflammatory changes. Spleen: Normal in size without focal abnormality. Adrenals/Urinary Tract: No adrenal masses. Normal kidneys, ureters and bladder. Stomach/Bowel: There is small bowel dilation, maximum diameter 3.8 cm. This begins just beyond the ligament of Treitz extending to the mid small bowel, with a transition point noted at the level of the umbilicus. Small bowel distal to this is decompressed. There is a trace amount fluid in the mesentery adjacent to the small bowel at the level of the transition. No bowel wall thickening. Stomach is unremarkable. Stool there are scattered colonic diverticula. No colonic dilation, wall thickening or inflammation. Vascular/Lymphatic: Minimal aortic atherosclerosis. No aneurysm. No other vascular abnormality. No enlarged lymph nodes. Reproductive: Status post hysterectomy. No adnexal masses. Other:  Small fat containing umbilical hernia.  No bowel enters this. Musculoskeletal: No acute or significant osseous findings. IMPRESSION: 1. Partial small bowel obstruction with a transition point at the level of the umbilicus, likely due to adhesions. 2. No other acute abnormality within the abdomen or pelvis. Electronically Signed   By: Amie Portland M.D.   On: 01/24/2020 10:22    Procedures Procedures (including critical care time)  Medications Ordered in ED Medications  potassium chloride 10 mEq in 100 mL IVPB (has no administration in time range)  enoxaparin (LOVENOX) injection 40 mg (has no administration in time range)  lactated ringers infusion (has no administration in time range)  acetaminophen (TYLENOL) tablet 650 mg (has no administration in time range)    Or  acetaminophen (TYLENOL) suppository 650 mg (has no administration in time range)  ketorolac (TORADOL) 30 MG/ML injection 30 mg (has no administration in time range)  sodium chloride flush (NS) 0.9 % injection 3 mL (3 mLs Intravenous Given 01/24/20 0914)  sodium chloride 0.9 % bolus 1,000 mL (0 mLs Intravenous Stopped 01/24/20 1034)  iohexol (OMNIPAQUE) 300 MG/ML solution 100 mL (100 mLs Intravenous Contrast Given 01/24/20 0941)    ED Course  I have reviewed the triage vital signs and the nursing notes.  Pertinent labs & imaging results that were available during my care of the patient were reviewed by me and considered in my medical decision making (see chart for details).  59 year old female presents with periumbilical abdominal pain, nausea, vomiting and no bowel movement or passing gas for the past day.  Vital signs are reassuring.  Heart is regular rate and rhythm.  Lungs are clear to auscultation.  Abdomen is soft and minimally tender in the periumbilical area.  Patient is not in any distress is not nauseous currently or in significant pain.  Lab work is remarkable for mild hypokalemia (3.1).  Will order CT abdomen pelvis to  rule out bowel obstruction since she states it feels similar to prior bowel obstruction  CT shows small bowel obstruction with transition point.  Discussed with Tresa Endo, [place] with general surgery who will come to see the patient.  She is requesting medicine admission.  Discussed with internal medicine who will come and admit.  Covid swab ordered.  MDM Rules/Calculators/A&P                           Final Clinical Impression(s) / ED Diagnoses Final diagnoses:  SBO (small bowel obstruction) [facility])    Rx / DC Orders ED Discharge Orders    None       Recardo Evangelist, PA-C 01/24/20 1150    Blanchie Dessert, MD 01/24/20 2100

## 2020-01-24 NOTE — ED Notes (Signed)
Got patient a blanket patient is resting with call bell in reach

## 2020-01-24 NOTE — ED Notes (Signed)
Attempted ng x2. Patient vomited after second attempt.   Dr. Dwain Sarna and Dr. Mcarthur Rossetti & Nedra Hai aware of patient unable to tolerate ng tube insertion. Will hold off on tube insertion for now. Patient to remain npo

## 2020-01-24 NOTE — Progress Notes (Signed)
Patient still refusing NGT insertion at this time .

## 2020-01-24 NOTE — ED Notes (Signed)
Attempted to call report, nurse unavailable at this time.

## 2020-01-24 NOTE — H&P (Signed)
Date: 01/24/2020               Patient Name:  Cynthia Palmer MRN: [id number]  DOB: [birth year] Age / Sex: 59 y.o., female   PCP: [facility] [place] Service: Internal Medicine Teaching Service         Attending Physician: Dr. Sid Falcon, MD    First Contact: Dr. Harvie Heck Pager: [phone]  Second Contact: Dr. Gilberto Better Pager: [phone]       After Hours (After 5p/  First Contact Pager: [phone]  weekends / holidays): Second Contact Pager: [phone]   Chief Complaint: Abdominal pain  History of Present Illness:  [name] is a 59 yo F w/ PMH of prior SBO due to adhesions s/p ex lap presenting to [facility] w/ complaints of nausea, vomiting and abdominal pain. She was in her usual state of health until last night at 11pm when she had acute onset abdominal pain without any obvious inciting event. Pain was accompanied with three episodes of emesis. She mentions possibly seeing scant bloody mucus but denies any frank blood. She mentions eating oatmeal and apples as her last meal but this is her usual diet. She mentions having her last bowel movement around 4pm earlier that day but no further movement since then.  She states that she has had prior episode of bowel obstruction requiring exploratry laparotomy in 2011. Her other surgical history includes hysterectomy (vaginal approach) and appendectomy. She mentions her symptoms mirror those from her prior bowel obstruction.  In the ED, she was found to have partial small bowel obstruction w/ transition point at umbilicus. General surgery was consulted who recommended medical admission. IMTS consulted for admission.  Meds:  Current Meds  Medication Sig  . betamethasone dipropionate (DIPROLENE) 0.05 % ointment Apply 1 application topically 2 (two) times daily.  . cloNIDine (CATAPRES) 0.1 MG tablet Take 0.1 mg by mouth 3 (three) times daily as needed. Hot flashes  . diphenhydrAMINE (BENADRYL) 25 MG tablet Take 1  tablet (25 mg total) by mouth every 6 (six) hours as needed. (Patient taking differently: Take 25 mg by mouth every 6 (six) hours as needed for itching. )  . DM-Phenylephrine-Acetaminophen (ALKA-SELTZER PLS SINUS & COUGH) 10-5-325 MG CAPS Take 2 tablets by mouth every 6 (six) hours as needed (allergies).  . hydrochlorothiazide (HYDRODIURIL) 25 MG tablet Take 25 mg by mouth daily.  . hydrOXYzine (VISTARIL) 25 MG capsule Take 25 mg by mouth 4 (four) times daily as needed for itching.  . triamcinolone ointment (KENALOG) 0.5 % Apply 1 application topically 2 (two) times daily. (Patient taking differently: Apply 1 application topically 2 (two) times daily as needed (insect bites, rash). )   Allergies: Allergies as of 01/24/2020  . (No Known Allergies)   Past Medical History:  Diagnosis Date  . Acid reflux   . Prolapse of mitral valve   . Sinusitis    Family History:  Father had heart attacks Sister has ESRD, Diabetes and 4x Mis at early age  Social History:  Takes care of her mother. Works at the [employer]. Has 3 children. Would like her daughter to be contacted in emergency. Drinks 12 oz beer daily. Smokes 1 cigarettes daily. Denies any illicit substance use.  Review of Systems: A complete ROS was negative except as per HPI.  Physical Exam: Blood pressure 129/86, pulse 93, temperature 99.1 F (37.3 C), temperature source Oral, resp. rate 16, height 5\' 5"  (  1.651 m), weight 70.3 kg, SpO2 94 %.  Gen: Well-developed, well nourished, NAD HEENT: NCAT head, hearing intact, EOMI, MMM Neck: supple, ROM intact CV: RRR, S1, S2 normal, No rubs, no murmurs, no gallops Pulm: CTAB, No rales, no wheezes Abd: Soft, Hypoactive bowel sounds, Diffuse tenderness to palpation w/ point tenderness around the umbilicus Extm: ROM intact, Peripheral pulses intact, No peripheral edema Skin: Dry, Warm, normal turgor, well-healed laparotomy scar Neuro: AAOx3  EKG: personally reviewed my interpretation is  normal sinus, no ischemic changes, no prior EKG to compare  CXR: N/A  Assessment & Plan by Problem: Active Problems:   Partial small bowel obstruction (HCC)  [name] is a 59 yo F w/ PMH of sbo s/p ex-lap admit for nausea/vomiting 2/2 partial bowel obstruction  Partial Bowel Obstruction Last BM 01/23/20 4pm. No flatulence or bowel movement since then. CT confirming partial bowel obstruction. Surgery on board. - Appreciate surgery recs: SBO protocol - NG tube - NPO - Fluids - Pain control w/ IV toradol - IV zofran for nausea  HTN On hctz 25mg  daily at home. Admit bp 129/86 - Hold oral meds in setting of bowel obstruction  DVT prophx: lovenox Diet: NPO Code: Full  Prior to Admission Living Arrangement: Home Anticipated Discharge Location: home Barriers to Discharge: Medical treatment Dispo: Anticipated discharge in approximately 1-2 day(s).   Dispo: Admit patient to Observation with expected length of stay less than 2 midnights.  Signed: , MD 01/24/2020, 4:27 PM Pager: [phone] After 5pm on weekdays and 1pm on weekends: On Call Pager: [phone]

## 2020-01-24 NOTE — ED Triage Notes (Signed)
Pt presents to ED POV. Pt c/o abd pain. Pt reports that she believe she has bowel obstruction. Had bowel movement x3 today, emesis x3. Pt reports being distended. Abdomen soft.

## 2020-01-24 NOTE — Consult Note (Signed)
Reason for Consult:abd pain, n/v Referring Physician: Dr Marvel Plan Cynthia Palmer is an 59 y.o. female.  HPI: 31 yof with history of HTN, prior appy/hysterectomy/elap with loa in 2011 here also presents with about 24 hours of abdominal pain, n/v.  No bm or flatus since yesterday. Feels bloated.  Urinating fine.  She came to er as this was worsening and nothing was helping at home.  This felt like her last sbo 10 years ago.  She underwent evaluation with ct scan that showed likely sbo and I was asked to see her.    Past Medical History:  Diagnosis Date  . Acid reflux   . Prolapse of mitral valve   . Sinusitis     Past Surgical History:  Procedure Laterality Date  . ABDOMINAL HYSTERECTOMY    . APPENDECTOMY    . BUNIONECTOMY    . TONSILLECTOMY      Family History  Problem Relation Age of Onset  . Stroke Mother   . Heart attack Father   . Hypertension Father   . Diabetes Father     Social History:  reports that she has been smoking cigarettes. She has been smoking about 0.10 packs per day. She quit smokeless tobacco use about 8 years ago. She reports current alcohol use. She reports that she does not use drugs.  Allergies: No Known Allergies  Medications: I have reviewed the patient's current medications.  Results for orders placed or performed during the hospital encounter of 01/24/20 (from the past 48 hour(s))  Urinalysis, Routine w reflex microscopic     Status: Abnormal   Collection Time: 01/24/20  1:17 AM  Result Value Ref Range   Color, Urine YELLOW YELLOW   APPearance HAZY (A) CLEAR   Specific Gravity, Urine 1.017 1.005 - 1.030   pH 8.0 5.0 - 8.0   Glucose, UA NEGATIVE NEGATIVE mg/dL   Hgb urine dipstick NEGATIVE NEGATIVE   Bilirubin Urine NEGATIVE NEGATIVE   Ketones, ur 5 (A) NEGATIVE mg/dL   Protein, ur 30 (A) NEGATIVE mg/dL   Nitrite NEGATIVE NEGATIVE   Leukocytes,Ua NEGATIVE NEGATIVE   RBC / HPF 0-5 0 - 5 RBC/hpf   WBC, UA 0-5 0 - 5 WBC/hpf   Bacteria, UA  RARE (A) NONE SEEN   Squamous Epithelial / LPF 0-5 0 - 5   Mucus PRESENT    Amorphous Crystal PRESENT     Comment: Performed at [facility], [address] [place], [place] [address]  Lipase, blood     Status: None   Collection Time: 01/24/20  1:24 AM  Result Value Ref Range   Lipase 27 11 - 51 U/L    Comment: Performed at [facility] [address], [place], [place] [address]  Comprehensive metabolic panel     Status: Abnormal   Collection Time: 01/24/20  1:24 AM  Result Value Ref Range   Sodium 139 135 - 145 mmol/L   Potassium 3.1 (L) 3.5 - 5.1 mmol/L   Chloride 94 (L) 98 - 111 mmol/L   CO2 30 22 - 32 mmol/L   Glucose, Bld 128 (H) 70 - 99 mg/dL    Comment: Glucose reference range applies only to samples taken after fasting for at least 8 hours.   BUN 10 6 - 20 mg/dL   Creatinine, Ser 0.86 0.44 - 1.00 mg/dL   Calcium 9.7 8.9 - 10.3 mg/dL   Total Protein 7.5 6.5 - 8.1 g/dL   Albumin 4.2 3.5 -  5.0 g/dL   AST 19 15 - 41 U/L   ALT 14 0 - 44 U/L   Alkaline Phosphatase 58 38 - 126 U/L   Total Bilirubin 0.6 0.3 - 1.2 mg/dL   GFR calc non Af Amer >60 >60 mL/min   GFR calc Af Amer >60 >60 mL/min   Anion gap 15 5 - 15    Comment: Performed at [facility], [address] [place], [place] [address]  CBC     Status: None   Collection Time: 01/24/20  1:24 AM  Result Value Ref Range   WBC 10.2 4.0 - 10.5 K/uL   RBC 4.86 3.87 - 5.11 MIL/uL   Hemoglobin 14.1 12.0 - 15.0 g/dL   HCT 60.4 36 - 46 %   MCV 87.9 80.0 - 100.0 fL   MCH 29.0 26.0 - 34.0 pg   MCHC 33.0 30.0 - 36.0 g/dL   RDW 54.0 98.1 - 19.1 %   Platelets 299 150 - 400 K/uL   nRBC 0.0 0.0 - 0.2 %    Comment: Performed at [facility], [address], [place], [place] [address]    CT Abdomen Pelvis W Contrast  Result Date: 01/24/2020 CLINICAL DATA:  Abdominal pain. Patient concerned for bowel obstruction. EXAM: CT ABDOMEN AND PELVIS WITH CONTRAST TECHNIQUE: Multidetector CT imaging of the abdomen  and pelvis was performed using the standard protocol following bolus administration of intravenous contrast. CONTRAST:  OMNIPAQUE IOHEXOL 300 MG/ML  SOLN COMPARISON:  09/26/2009 FINDINGS: Lower chest: No acute abnormality. Hepatobiliary: Normal liver size and overall attenuation. Subcentimeter low-density lesion, anterolateral segment of the left lobe, tiny low-density lesion centrally and small area of low attenuation adjacent to a portal branch in the inferior right lobe, not fully characterized but likely either cysts or small hemangiomas. No other liver abnormality. Normal gallbladder. No bile duct dilation. Pancreas: Unremarkable. No pancreatic ductal dilatation or surrounding inflammatory changes. Spleen: Normal in size without focal abnormality. Adrenals/Urinary Tract: No adrenal masses. Normal kidneys, ureters and bladder. Stomach/Bowel: There is small bowel dilation, maximum diameter 3.8 cm. This begins just beyond the ligament of Treitz extending to the mid small bowel, with a transition point noted at the level of the umbilicus. Small bowel distal to this is decompressed. There is a trace amount fluid in the mesentery adjacent to the small bowel at the level of the transition. No bowel wall thickening. Stomach is unremarkable. Stool there are scattered colonic diverticula. No colonic dilation, wall thickening or inflammation. Vascular/Lymphatic: Minimal aortic atherosclerosis. No aneurysm. No other vascular abnormality. No enlarged lymph nodes. Reproductive: Status post hysterectomy. No adnexal masses. Other: Small fat containing umbilical hernia.  No bowel enters this. Musculoskeletal: No acute or significant osseous findings. IMPRESSION: 1. Partial small bowel obstruction with a transition point at the level of the umbilicus, likely due to adhesions. 2. No other acute abnormality within the abdomen or pelvis. Electronically Signed   By: Amie Portland M.D.   On: 01/24/2020 10:22    Review of  Systems  Gastrointestinal: Positive for abdominal distention, abdominal pain, constipation, nausea and vomiting.  All other systems reviewed and are negative.  Blood pressure (!) 118/92, pulse 94, temperature 98.9 F (37.2 C), temperature source Oral, resp. rate 14, height 5\' 5"  (1.651 m), weight 70.3 kg, SpO2 100 %. Physical Exam Constitutional:      Appearance: She is well-developed.  HENT:     Head: Normocephalic and atraumatic.     Mouth/Throat:  Mouth: Mucous membranes are moist.     Pharynx: Oropharynx is clear.  Eyes:     General: No scleral icterus. Cardiovascular:     Rate and Rhythm: Normal rate and regular rhythm.  Pulmonary:     Effort: Pulmonary effort is normal.     Breath sounds: Normal breath sounds.  Abdominal:     General: Abdomen is flat. Bowel sounds are decreased. There is distension.     Tenderness: There is no abdominal tenderness.     Hernia: No hernia is present.     Comments: Midline scar healed, mildly distended, no bs, minimally tender   Skin:    General: Skin is warm and dry.     Capillary Refill: Capillary refill takes less than 2 seconds.  Neurological:     General: No focal deficit present.     Mental Status: She is alert.     Assessment/Plan: SBO -likely adhesive, no indication for surgery at this point -sbo protocol, will follow   Emelia Loron 01/24/2020, 12:05 PM

## 2020-01-25 ENCOUNTER — Observation Stay (HOSPITAL_COMMUNITY): Payer: 59

## 2020-01-25 DIAGNOSIS — K56609 Unspecified intestinal obstruction, unspecified as to partial versus complete obstruction: Secondary | ICD-10-CM | POA: Diagnosis not present

## 2020-01-25 DIAGNOSIS — I10 Essential (primary) hypertension: Secondary | ICD-10-CM | POA: Diagnosis not present

## 2020-01-25 LAB — COMPREHENSIVE METABOLIC PANEL
ALT: 15 U/L (ref 0–44)
AST: 18 U/L (ref 15–41)
Albumin: 3.5 g/dL (ref 3.5–5.0)
Alkaline Phosphatase: 49 U/L (ref 38–126)
Anion gap: 10 (ref 5–15)
BUN: 10 mg/dL (ref 6–20)
CO2: 28 mmol/L (ref 22–32)
Calcium: 9 mg/dL (ref 8.9–10.3)
Chloride: 103 mmol/L (ref 98–111)
Creatinine, Ser: 0.99 mg/dL (ref 0.44–1.00)
GFR calc Af Amer: >60 mL/min (ref >60)
GFR calc non Af Amer: >60 mL/min (ref >60)
Glucose, Bld: 93 mg/dL (ref 70–99)
Potassium: 3.3 mmol/L — ABNORMAL LOW (ref 3.5–5.1)
Sodium: 141 mmol/L (ref 135–145)
Total Bilirubin: 0.6 mg/dL (ref 0.3–1.2)
Total Protein: 6.3 g/dL — ABNORMAL LOW (ref 6.5–8.1)

## 2020-01-25 LAB — CBC
HCT: 38.9 % (ref 36.0–46.0)
Hemoglobin: 12.4 g/dL (ref 12.0–15.0)
MCH: 28.8 pg (ref 26.0–34.0)
MCHC: 31.9 g/dL (ref 30.0–36.0)
MCV: 90.5 fL (ref 80.0–100.0)
Platelets: 232 10*3/uL (ref 150–400)
RBC: 4.3 MIL/uL (ref 3.87–5.11)
RDW: 14.1 % (ref 11.5–15.5)
WBC: 6.5 10*3/uL (ref 4.0–10.5)
nRBC: 0 % (ref 0.0–0.2)

## 2020-01-25 LAB — HIV ANTIBODY (ROUTINE TESTING W REFLEX): HIV Screen 4th Generation wRfx: NONREACTIVE

## 2020-01-25 NOTE — Plan of Care (Signed)
D/C to home with family VSS. Pain denied breathing regular and unlabored.  Instructions given and explained with understanding

## 2020-01-25 NOTE — Progress Notes (Signed)
   Subjective: HD 1 Overnight, no acute events reported  Cynthia Palmer was examined and evaluated at bedside this am. She mentions significant improvement in her abdominal pain after having ~4 bowel movements last night and 1 bowel movement this am. She mentions feeling hungry and requesting diet. She states she would like to avoid surgery if possible.   Objective:  Vital signs in last 24 hours: Vitals:   01/24/20 1503 01/24/20 1516 01/24/20 2002 01/25/20 0649  BP:  129/86 (!) 119/94 119/88  Pulse:  93 97 76  Resp:  16 18 18   Temp: 99 F (37.2 C) 99.1 F (37.3 C) 99.2 F (37.3 C) 98 F (36.7 C)  TempSrc: Oral Oral Oral Oral  SpO2:  94% 94% 94%  Weight:      Height:       CBC Latest Ref Rng & Units 01/25/2020 01/24/2020 09/30/2009  WBC 4.0 - 10.5 K/uL 6.5 10.2 4.2  Hemoglobin 12.0 - 15.0 g/dL 11/30/2009 27.0 11.1(L)  Hematocrit 36 - 46 % 38.9 42.7 33.3(L)  Platelets 150 - 400 K/uL 232 299 185   CMP Latest Ref Rng & Units 01/25/2020 01/24/2020 09/30/2009  Glucose 70 - 99 mg/dL 93 11/30/2009) 093(G)  BUN 6 - 20 mg/dL 10 10 3(L)  Creatinine 0.44 - 1.00 mg/dL 182(X 9.37 1.69  Sodium 135 - 145 mmol/L 141 139 134(L)  Potassium 3.5 - 5.1 mmol/L 3.3(L) 3.1(L) 3.9  Chloride 98 - 111 mmol/L 103 94(L) 100  CO2 22 - 32 mmol/L 28 30 31   Calcium 8.9 - 10.3 mg/dL 9.0 9.7 8.1(L)  Total Protein 6.5 - 8.1 g/dL 6.3(L) 7.5 -  Total Bilirubin 0.3 - 1.2 mg/dL 0.6 0.6 -  Alkaline Phos 38 - 126 U/L 49 58 -  AST 15 - 41 U/L 18 19 -  ALT 0 - 44 U/L 15 14 -   Physical Exam  Constitutional: Appears well-developed and well-nourished. No distress.  HENT:  Normocephalic and atraumatic. EOMI, MMM, Conjunctivae are normal.  Cardiovascular: slightly tachycardic; regular rhythm, S1 and S2 present, no murmurs/rubs/gallops, distal pulses intact  Respiratory: No respiratory distress, effort normal, on room air, CTAB GI: Soft. No distension. Hyperactive bowel sounds, minimal tenderness to palpation in LUQ and  LLQ Musculoskeletal: No edema. SCDs in bilateral lower extremities, normal muscle tone and bulk Neurological: Is alert and oriented x4, no focal neurologic deficits  Skin: Warm and dry.  Psychiatric: Normal mood and affect. Behavior is normal. Judgment and thought content normal.  Assessment/Plan:  Active Problems:   Partial small bowel obstruction (HCC) Cynthia Palmer is a 59 year old female with PMHx of mitral valve prolapse, hypertension and prior SBO requiring ex-lap admitted for partial SBO.  Partial SBO Patient managed with conservative treatment. She notes resolution of SBO with four bowel movements overnight and one this morning. She notes abdominal pain and nausea improved. No further episodes of emesis.  - Advance diet as tolerated - Medically stable for discharge if able to tolerate diet  Hypertension: Currently normotensive. - Resume home HCTZ on discharge   Prior to Admission Living Arrangement: Home  Anticipated Discharge Location: Home Barriers to Discharge: oral intake Dispo: Anticipated discharge in approximately 0-1 day(s).   Laurette Schimke, MD  [facility] PGY-1 01/25/2020, 10:05 AM Pager: [phone] After 5pm on weekdays and 1pm on weekends: On Call pager [phone]

## 2020-01-25 NOTE — Progress Notes (Signed)
Patient ID: Cynthia Palmer, female   DOB: 07/08/1961, 59 y.o.   MRN: [id number] [facility] Surgery Progress Note:   * No surgery found *  Subjective: Mental status is clear.  Complaints none. Objective: Vital signs in last 24 hours: Temp:  [98 F (36.7 C)-99.2 F (37.3 C)] 98 F (36.7 C) (06/27 0649) Pulse Rate:  [76-103] 76 (06/27 0649) Resp:  [14-18] 18 (06/27 0649) BP: (104-129)/(86-96) 119/88 (06/27 0649) SpO2:  [94 %-100 %] 94 % (06/27 0649)  Intake/Output from previous day: 06/26 0701 - 06/27 0700 In: 1759.9 [I.V.:1659.9; IV Piggyback:100] Out: -  Intake/Output this shift: No intake/output data recorded.  Physical Exam: Work of breathing is normal;  She passed 4 BMs last night and is feeling good.    Lab Results:  Results for orders placed or performed during the hospital encounter of 01/24/20 (from the past 48 hour(s))  Urinalysis, Routine w reflex microscopic     Status: Abnormal   Collection Time: 01/24/20  1:17 AM  Result Value Ref Range   Color, Urine YELLOW YELLOW   APPearance HAZY (A) CLEAR   Specific Gravity, Urine 1.017 1.005 - 1.030   pH 8.0 5.0 - 8.0   Glucose, UA NEGATIVE NEGATIVE mg/dL   Hgb urine dipstick NEGATIVE NEGATIVE   Bilirubin Urine NEGATIVE NEGATIVE   Ketones, ur 5 (A) NEGATIVE mg/dL   Protein, ur 30 (A) NEGATIVE mg/dL   Nitrite NEGATIVE NEGATIVE   Leukocytes,Ua NEGATIVE NEGATIVE   RBC / HPF 0-5 0 - 5 RBC/hpf   WBC, UA 0-5 0 - 5 WBC/hpf   Bacteria, UA RARE (A) NONE SEEN   Squamous Epithelial / LPF 0-5 0 - 5   Mucus PRESENT    Amorphous Crystal PRESENT     Comment: Performed at [facility], [address] [place], [place] [address]  Lipase, blood     Status: None   Collection Time: 01/24/20  1:24 AM  Result Value Ref Range   Lipase 27 11 - 51 U/L    Comment: Performed at [place] [address], [place], [place] [address]  Comprehensive metabolic panel     Status: Abnormal   Collection Time: 01/24/20  1:24 AM   Result Value Ref Range   Sodium 139 135 - 145 mmol/L   Potassium 3.1 (L) 3.5 - 5.1 mmol/L   Chloride 94 (L) 98 - 111 mmol/L   CO2 30 22 - 32 mmol/L   Glucose, Bld 128 (H) 70 - 99 mg/dL    Comment: Glucose reference range applies only to samples taken after fasting for at least 8 hours.   BUN 10 6 - 20 mg/dL   Creatinine, Ser 0.86 0.44 - 1.00 mg/dL   Calcium 9.7 8.9 - 10.3 mg/dL   Total Protein 7.5 6.5 - 8.1 g/dL   Albumin 4.2 3.5 - 5.0 g/dL   AST 19 15 - 41 U/L   ALT 14 0 - 44 U/L   Alkaline Phosphatase 58 38 - 126 U/L   Total Bilirubin 0.6 0.3 - 1.2 mg/dL   GFR calc non Af Amer >60 >60 mL/min   GFR calc Af Amer >60 >60 mL/min   Anion gap 15 5 - 15    Comment: Performed at [facility], [place] [address], [place], [place] [address]  CBC     Status: None   Collection Time: 01/24/20  1:24 AM  Result Value Ref Range   WBC 10.2 4.0 - 10.5 K/uL  RBC 4.86 3.87 - 5.11 MIL/uL   Hemoglobin 14.1 12.0 - 15.0 g/dL   HCT 38.7 36 - 46 %   MCV 87.9 80.0 - 100.0 fL   MCH 29.0 26.0 - 34.0 pg   MCHC 33.0 30.0 - 36.0 g/dL   RDW 56.4 33.2 - 95.1 %   Platelets 299 150 - 400 K/uL   nRBC 0.0 0.0 - 0.2 %    Comment: Performed at [facility], [address], [place], [place] [address]  SARS Coronavirus 2 by RT PCR (hospital order, performed in [facility] hospital lab) Nasopharyngeal Nasopharyngeal Swab     Status: None   Collection Time: 01/24/20 11:50 AM   Specimen: Nasopharyngeal Swab  Result Value Ref Range   SARS Coronavirus 2 NEGATIVE NEGATIVE    Comment: (NOTE) SARS-CoV-2 target nucleic acids are NOT DETECTED.  The SARS-CoV-2 RNA is generally detectable in upper and lower respiratory specimens during the acute phase of infection. The lowest concentration of SARS-CoV-2 viral copies this assay can detect is 250 copies / mL. A negative result does not preclude SARS-CoV-2 infection and should not be used as the sole basis for treatment or other patient management decisions.  A  negative result may occur with improper specimen collection / handling, submission of specimen other than nasopharyngeal swab, presence of viral mutation(s) within the areas targeted by this assay, and inadequate number of viral copies (<250 copies / mL). A negative result must be combined with clinical observations, patient history, and epidemiological information.  Fact Sheet for Patients:   BoilerBrush.com.cy  Fact Sheet for Healthcare Providers: https://pope.com/  This test is not yet approved or  cleared by the [place] FDA and has been authorized for detection and/or diagnosis of SARS-CoV-2 by FDA under an Emergency Use Authorization (EUA).  This EUA will remain in effect (meaning this test can be used) for the duration of the COVID-19 declaration under Section 564(b)(1) of the Act, 21 U.S.C. section 360bbb-3(b)(1), unless the authorization is terminated or revoked sooner.  Performed at [facility], [address], [place], [place] [address]   Comprehensive metabolic panel     Status: Abnormal   Collection Time: 01/25/20  3:40 AM  Result Value Ref Range   Sodium 141 135 - 145 mmol/L   Potassium 3.3 (L) 3.5 - 5.1 mmol/L   Chloride 103 98 - 111 mmol/L   CO2 28 22 - 32 mmol/L   Glucose, Bld 93 70 - 99 mg/dL    Comment: Glucose reference range applies only to samples taken after fasting for at least 8 hours.   BUN 10 6 - 20 mg/dL   Creatinine, Ser 1.60 0.44 - 1.00 mg/dL   Calcium 9.0 8.9 - 10.9 mg/dL   Total Protein 6.3 (L) 6.5 - 8.1 g/dL   Albumin 3.5 3.5 - 5.0 g/dL   AST 18 15 - 41 U/L   ALT 15 0 - 44 U/L   Alkaline Phosphatase 49 38 - 126 U/L   Total Bilirubin 0.6 0.3 - 1.2 mg/dL   GFR calc non Af Amer >60 >60 mL/min   GFR calc Af Amer >60 >60 mL/min   Anion gap 10 5 - 15    Comment: Performed at [facility], [address], [place], [place] [address]  CBC     Status: None   Collection Time: 01/25/20   3:40 AM  Result Value Ref Range   WBC 6.5 4.0 - 10.5 K/uL   RBC  4.30 3.87 - 5.11 MIL/uL   Hemoglobin 12.4 12.0 - 15.0 g/dL   HCT 06.3 36 - 46 %   MCV 90.5 80.0 - 100.0 fL   MCH 28.8 26.0 - 34.0 pg   MCHC 31.9 30.0 - 36.0 g/dL   RDW 01.6 01.0 - 93.2 %   Platelets 232 150 - 400 K/uL   nRBC 0.0 0.0 - 0.2 %    Comment: Performed at [facility], [address] [place], [place] [address]    Radiology/Results: CT Abdomen Pelvis W Contrast  Result Date: 01/24/2020 CLINICAL DATA:  Abdominal pain. Patient concerned for bowel obstruction. EXAM: CT ABDOMEN AND PELVIS WITH CONTRAST TECHNIQUE: Multidetector CT imaging of the abdomen and pelvis was performed using the standard protocol following bolus administration of intravenous contrast. CONTRAST:  OMNIPAQUE IOHEXOL 300 MG/ML  SOLN COMPARISON:  09/26/2009 FINDINGS: Lower chest: No acute abnormality. Hepatobiliary: Normal liver size and overall attenuation. Subcentimeter low-density lesion, anterolateral segment of the left lobe, tiny low-density lesion centrally and small area of low attenuation adjacent to a portal branch in the inferior right lobe, not fully characterized but likely either cysts or small hemangiomas. No other liver abnormality. Normal gallbladder. No bile duct dilation. Pancreas: Unremarkable. No pancreatic ductal dilatation or surrounding inflammatory changes. Spleen: Normal in size without focal abnormality. Adrenals/Urinary Tract: No adrenal masses. Normal kidneys, ureters and bladder. Stomach/Bowel: There is small bowel dilation, maximum diameter 3.8 cm. This begins just beyond the ligament of Treitz extending to the mid small bowel, with a transition point noted at the level of the umbilicus. Small bowel distal to this is decompressed. There is a trace amount fluid in the mesentery adjacent to the small bowel at the level of the transition. No bowel wall thickening. Stomach is unremarkable. Stool there are scattered  colonic diverticula. No colonic dilation, wall thickening or inflammation. Vascular/Lymphatic: Minimal aortic atherosclerosis. No aneurysm. No other vascular abnormality. No enlarged lymph nodes. Reproductive: Status post hysterectomy. No adnexal masses. Other: Small fat containing umbilical hernia.  No bowel enters this. Musculoskeletal: No acute or significant osseous findings. IMPRESSION: 1. Partial small bowel obstruction with a transition point at the level of the umbilicus, likely due to adhesions. 2. No other acute abnormality within the abdomen or pelvis. Electronically Signed   By: Amie Portland M.D.   On: 01/24/2020 10:22   DG Abd Portable 1V-Small Bowel Obstruction Protocol-initial, 8 hr delay  Result Date: 01/25/2020 CLINICAL DATA:  Small-bowel obstruction follow-up EXAM: PORTABLE ABDOMEN - 1 VIEW COMPARISON:  01/24/2020 CT abdomen pelvis FINDINGS: There is enteric contrast material throughout the colon. No dilated loops of bowel. IMPRESSION: Nonobstructive bowel gas and contrast pattern. Electronically Signed   By: Deatra Robinson M.D.   On: 01/25/2020 01:58    Anti-infectives: Anti-infectives (From admission, onward)   None      Assessment/Plan: Problem List: Patient Active Problem List   Diagnosis Date Noted  . Partial small bowel obstruction (HCC) 01/24/2020    OK to advance diet and discharge as she progresses.  Contrast throughout the colon.   * No surgery found *    LOS: 0 days   Matt B. Daphine Deutscher, MD, [facility] [phone] to reach the surgeon on call.    01/25/2020 9:32 AM

## 2020-01-25 NOTE — Discharge Summary (Signed)
Name: Cynthia Palmer MRN: [id number] DOB: [birth year] 59 y.o. PCP: [facility] [place]  Date of Admission: 01/24/2020 12:57 AM Date of Discharge: 01/25/2020 Attending Physician: Sid Falcon, MD  Discharge Diagnosis: 1. Partial SBO 2. Hypertension  Discharge Medications: Allergies as of 01/25/2020   No Known Allergies     Medication List    STOP taking these medications   hydrOXYzine 25 MG tablet Commonly known as: ATARAX/VISTARIL   ipratropium 0.06 % nasal spray Commonly known as: Atrovent   ondansetron 4 MG tablet Commonly known as: ZOFRAN   predniSONE 20 MG tablet Commonly known as: DELTASONE     TAKE these medications   Alka-Seltzer Pls Sinus & Cough 10-5-325 MG Caps Generic drug: DM-Phenylephrine-Acetaminophen Take 2 tablets by mouth every 6 (six) hours as needed (allergies).   betamethasone dipropionate 0.05 % ointment Commonly known as: DIPROLENE Apply 1 application topically 2 (two) times daily.   cloNIDine 0.1 MG tablet Commonly known as: CATAPRES Take 0.1 mg by mouth 3 (three) times daily as needed. Hot flashes   diphenhydrAMINE 25 MG tablet Commonly known as: BENADRYL Take 1 tablet (25 mg total) by mouth every 6 (six) hours as needed. What changed: reasons to take this   hydrochlorothiazide 25 MG tablet Commonly known as: HYDRODIURIL Take 25 mg by mouth daily.   hydrOXYzine 25 MG capsule Commonly known as: VISTARIL Take 25 mg by mouth 4 (four) times daily as needed for itching.   triamcinolone ointment 0.5 % Commonly known as: KENALOG Apply 1 application topically 2 (two) times daily. What changed:   when to take this  reasons to take this   zolpidem 10 MG tablet Commonly known as: AMBIEN TAKE ONE TABLET BY MOUTH AT BEDTIME AS NEEDED       Disposition and follow-up:   [name] was discharged from [facility] in Stable condition.  At the hospital follow up visit please address:  1.  Partial  SBO:  - Advance diet as tolerated  Hypertension: Resume HCTZ 25mg  daily   2.  Labs / imaging needed at time of follow-up: None  3.  Pending labs/ test needing follow-up: None  Follow-up Appointments:  Follow-up [place]. Schedule an appointment as soon as possible for a visit in 1 week(s).   Contact information: [place] [phone] [place] by problem list: 1. Partial SBO Patient with history of SBO due to adhesions s/p ex lap presented with acute onset abdominal pain, nausea/vomiting, and obstipation for one day duration. No inciting event. She was found to have partial SBO with transition point at umbilicus. Surgery consulted and recommended for conservative management with NPO, bowel rest, pain management and IVF. Attempted NG tube, but unable to tolerate. Patient's SBO resolved overnight and tolerating diet. Patient discharged home to follow up with PCP.   2. Hypertension Patient with history of hypertension on HCTZ 25mg  daily. Normotensive during admission. Recommend continuing HCTZ 25mg  on discharge.   Discharge Vitals:   BP 119/88 (BP Location: Left Arm)   Pulse 76   Temp 98 F (36.7 C) (Oral)   Resp 18   Ht 5\' 5"  (1.651 m)   Wt 70.3 kg   SpO2 94%   BMI 25.79 kg/m   Pertinent Labs, Studies, and Procedures:  CBC Latest Ref Rng & Units 01/25/2020 01/24/2020 09/30/2009  WBC 4.0 - 10.5 K/uL  6.5 10.2 4.2  Hemoglobin 12.0 - 15.0 g/dL 42.7 06.2 11.1(L)  Hematocrit 36 - 46 % 38.9 42.7 33.3(L)  Platelets 150 - 400 K/uL 232 299 185   CMP Latest Ref Rng & Units 01/25/2020 01/24/2020 09/30/2009  Glucose 70 - 99 mg/dL 93 376(E) 831(D)  BUN 6 - 20 mg/dL 10 10 3(L)  Creatinine 0.44 - 1.00 mg/dL 1.76 1.60 7.37  Sodium 135 - 145 mmol/L 141 139 134(L)  Potassium 3.5 - 5.1 mmol/L 3.3(L) 3.1(L) 3.9  Chloride 98 - 111 mmol/L 103 94(L) 100  CO2 22 - 32 mmol/L 28 30 31   Calcium 8.9 - 10.3 mg/dL 9.0  9.7 8.1(L)  Total Protein 6.5 - 8.1 g/dL 6.3(L) 7.5 -  Total Bilirubin 0.3 - 1.2 mg/dL 0.6 0.6 -  Alkaline Phos 38 - 126 U/L 49 58 -  AST 15 - 41 U/L 18 19 -  ALT 0 - 44 U/L 15 14 -   Lipase 11 - 51 U/L 27    Urinalysis    Component Value Date/Time   COLORURINE YELLOW 01/24/2020 0117   APPEARANCEUR HAZY (A) 01/24/2020 0117   LABSPEC 1.017 01/24/2020 0117   PHURINE 8.0 01/24/2020 0117   GLUCOSEU NEGATIVE 01/24/2020 0117   HGBUR NEGATIVE 01/24/2020 0117   BILIRUBINUR NEGATIVE 01/24/2020 0117   KETONESUR 5 (A) 01/24/2020 0117   PROTEINUR 30 (A) 01/24/2020 0117   UROBILINOGEN 0.2 11/14/2014 1227   NITRITE NEGATIVE 01/24/2020 0117   LEUKOCYTESUR NEGATIVE 01/24/2020 0117   CT ABDOMEN PELVIS W CONTRAST 01/24/2020: IMPRESSION: 1. Partial small bowel obstruction with a transition point at the level of the umbilicus, likely due to adhesions. 2. No other acute abnormality within the abdomen or pelvis.   Discharge Instructions: Discharge Instructions    Call MD for:  difficulty breathing, headache or visual disturbances   Complete by: As directed    Call MD for:  extreme fatigue   Complete by: As directed    Call MD for:  hives   Complete by: As directed    Call MD for:  persistant dizziness or light-headedness   Complete by: As directed    Call MD for:  persistant nausea and vomiting   Complete by: As directed    Call MD for:  severe uncontrolled pain   Complete by: As directed    Call MD for:  temperature >100.4   Complete by: As directed    Diet - low sodium heart healthy   Complete by: As directed    Discharge instructions   Complete by: As directed    Cynthia Palmer,  You were admitted to the hospital with a partial small bowel obstruction. You were managed symptomatically  IV fluids and pain control with resolution of symptoms. On discharge, please continue to advance your diet as tolerated and follow up with your PCP.  Thank you!   Increase activity slowly   Complete  by: As directed       Signed: Zonia Kief, MD 01/25/2020, 1:37 PM   Pager: [phone]

## 2022-01-25 ENCOUNTER — Other Ambulatory Visit: Payer: Self-pay | Admitting: Gastroenterology

## 2022-01-25 DIAGNOSIS — Z1231 Encounter for screening mammogram for malignant neoplasm of breast: Secondary | ICD-10-CM

## 2022-02-13 IMAGING — CT CT ABD-PELV W/ CM
2 of 5 series · 16 of 46 positions shown, 18 images · IV contrast (APPLIED)
Comparison: 09/26/2009

CLINICAL DATA: Abdominal pain. Patient concerned for bowel
obstruction.

EXAM:
CT ABDOMEN AND PELVIS WITH CONTRAST
TECHNIQUE: Multidetector CT imaging of the abdomen and pelvis was performed
using the standard protocol following bolus administration of
intravenous contrast.
CONTRAST:  100mL OMNIPAQUE IOHEXOL 300 MG/ML  SOLN

[Series 3: abd/ pelvis 5.0 i30f 2 · axial · 0.93mm/px · z∈[+657,+1077]mm · 13 of 94 slices shown, 15 images]
[im 5/94  soft-tissue]
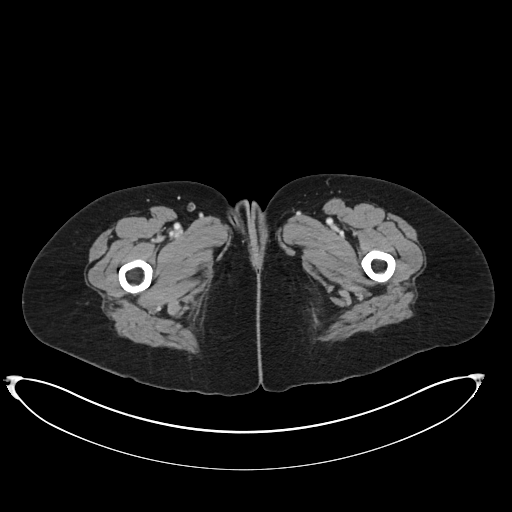
[im 5/94  bone]
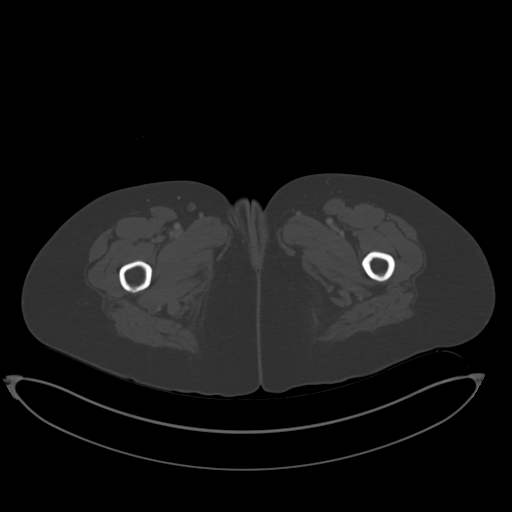
[im 14/94  soft-tissue]
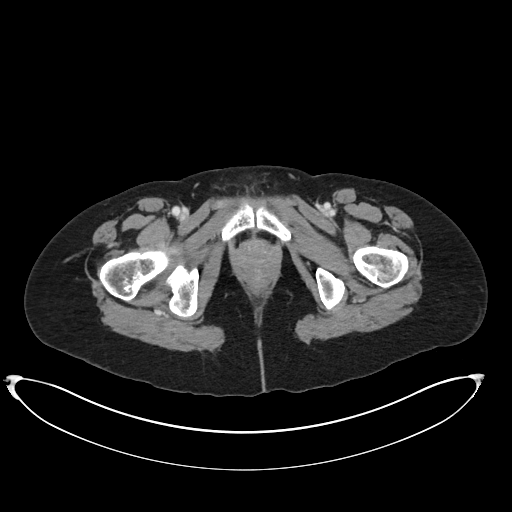
[im 19/94  soft-tissue]
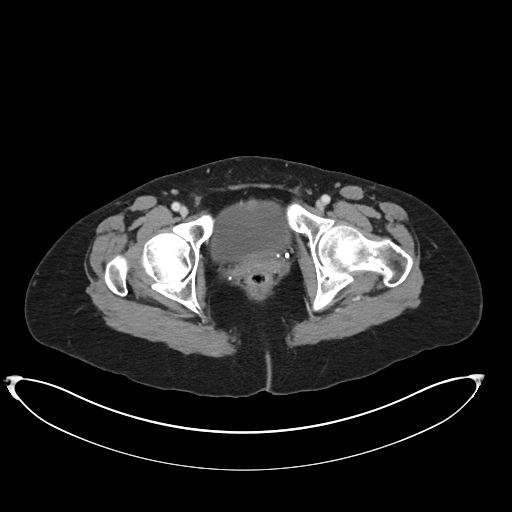
[im 28/94  soft-tissue]
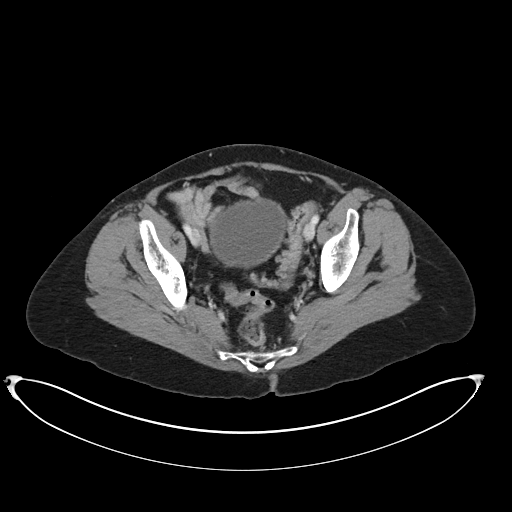
[im 33/94  soft-tissue]
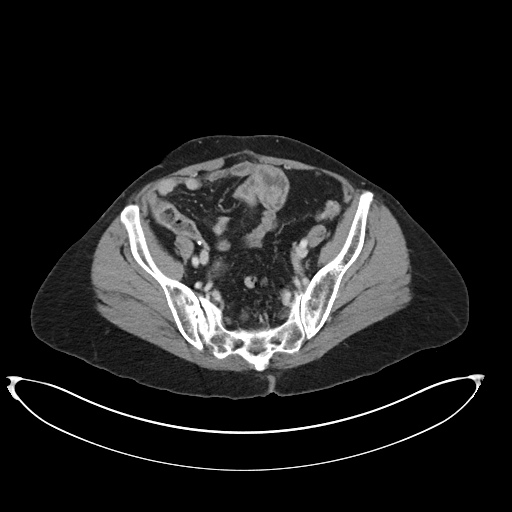
[im 42/94  soft-tissue]
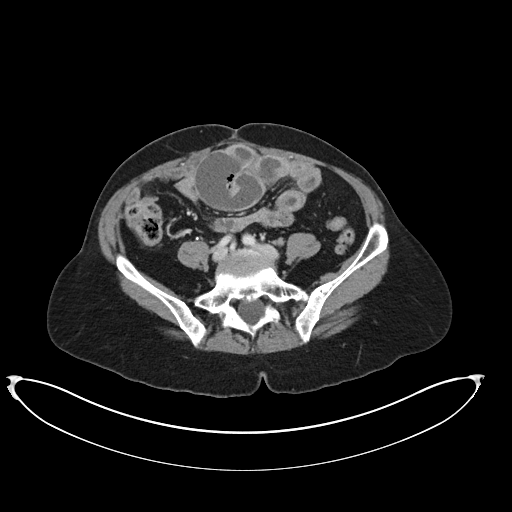
[im 47/94  soft-tissue]
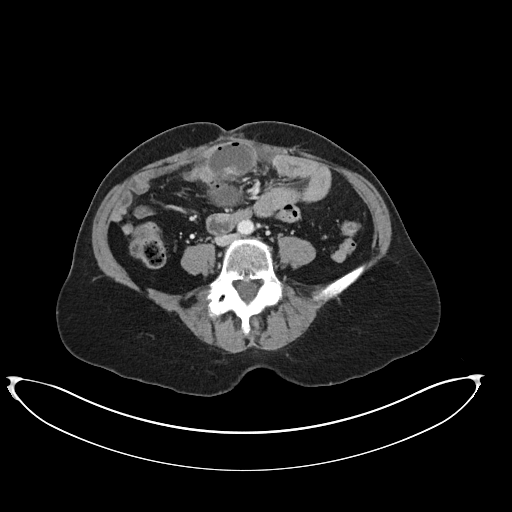
[im 52/94  soft-tissue]
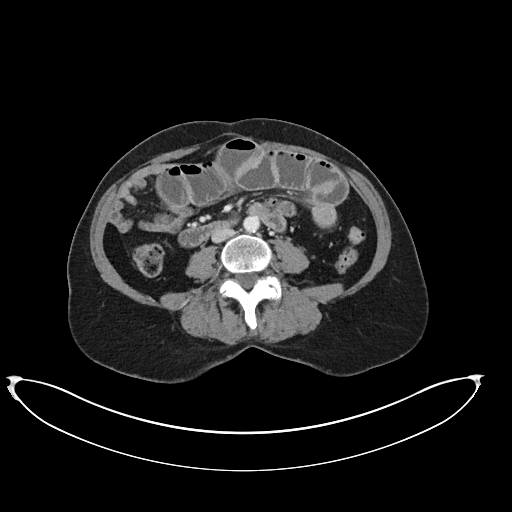
[im 61/94  soft-tissue]
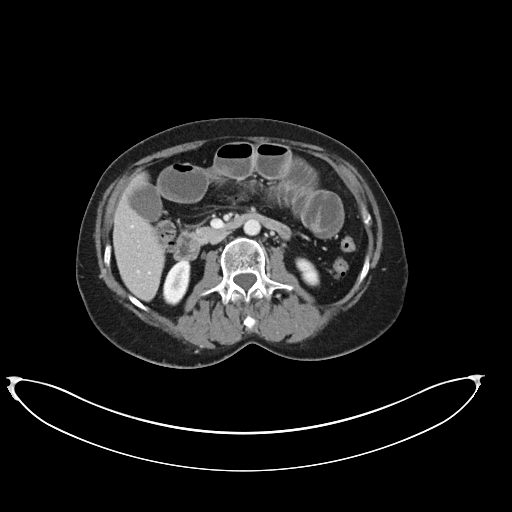
[im 61/94  bone]
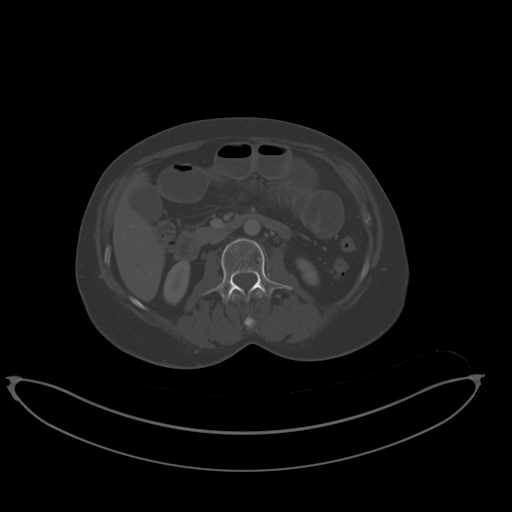
[im 66/94  soft-tissue]
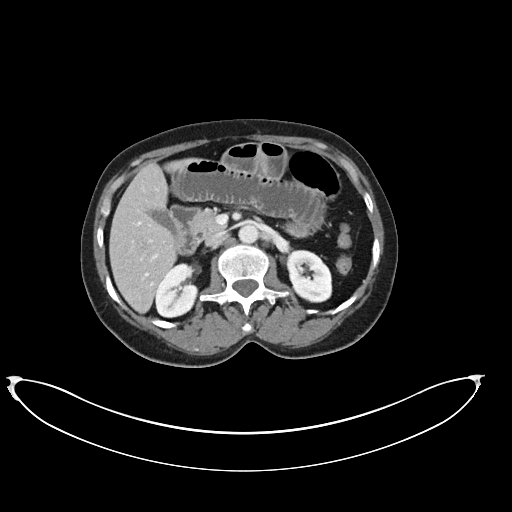
[im 75/94  soft-tissue]
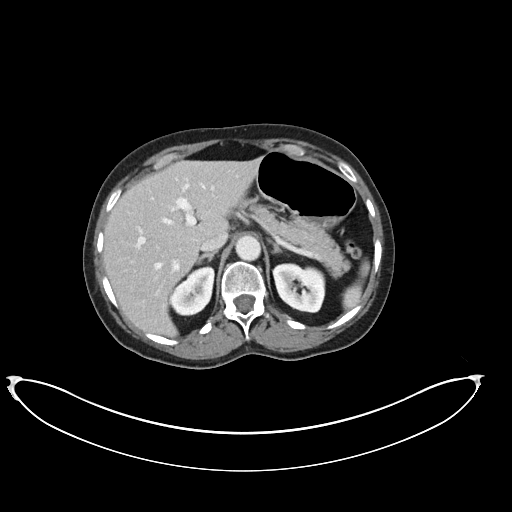
[im 80/94  soft-tissue]
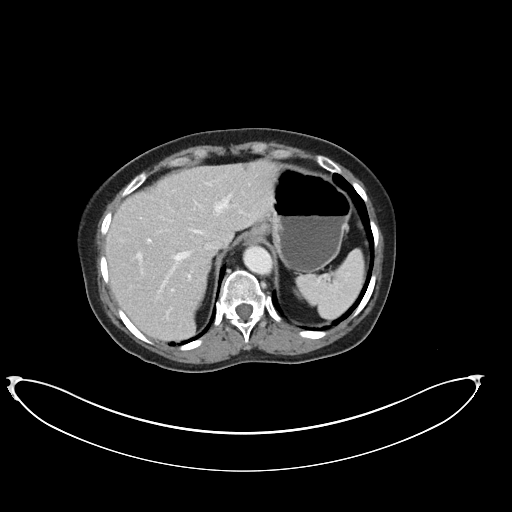
[im 89/94  soft-tissue]
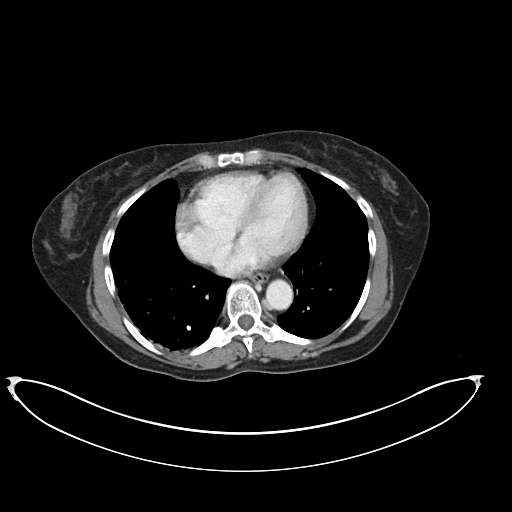

[Series 6: coronal soft tissue · coronal · 0.94mm/px · 3 of 101 slices shown]
[im 34/101  soft-tissue]
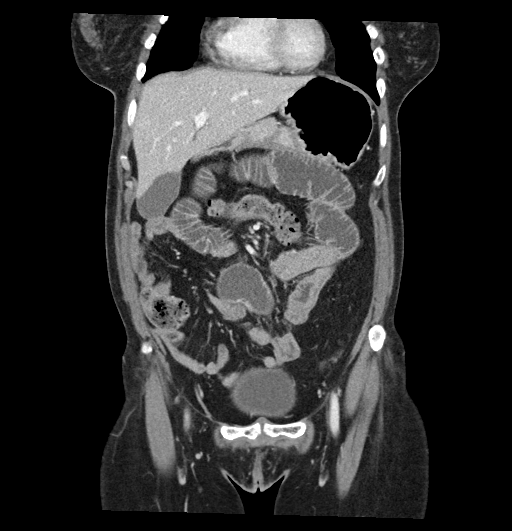
[im 45/101  soft-tissue]
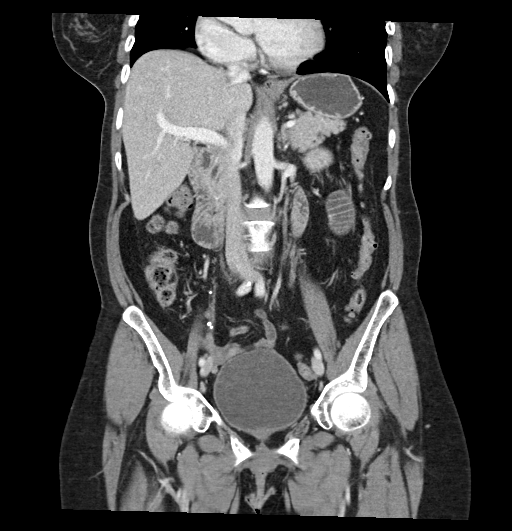
[im 56/101  soft-tissue]
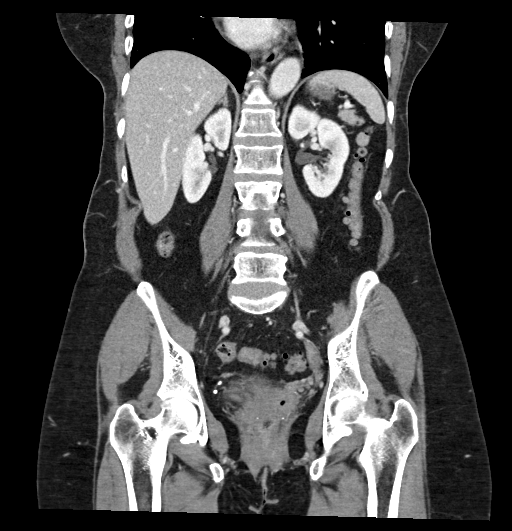

[16 of 46 positions shown; findings below may reference images not displayed]

FINDINGS: Lower chest: No acute abnormality.

Hepatobiliary: Normal liver size and overall attenuation.
Subcentimeter low-density lesion, anterolateral segment of the left
lobe, tiny low-density lesion centrally and small area of low
attenuation adjacent to a portal branch in the inferior right lobe,
not fully characterized but likely either cysts or small
hemangiomas. No other liver abnormality. Normal gallbladder. No bile
duct dilation.

Pancreas: Unremarkable. No pancreatic ductal dilatation or
surrounding inflammatory changes.

Spleen: Normal in size without focal abnormality.

Adrenals/Urinary Tract: No adrenal masses.

Normal kidneys, ureters and bladder.

Stomach/Bowel: There is small bowel dilation, maximum diameter
cm. This begins just beyond the ligament of Treitz extending to the
mid small bowel, with a transition point noted at the level of the
umbilicus. Small bowel distal to this is decompressed. There is a
trace amount fluid in the mesentery adjacent to the small bowel at
the level of the transition. No bowel wall thickening.

Stomach is unremarkable. Stool there are scattered colonic
diverticula. No colonic dilation, wall thickening or inflammation.

Vascular/Lymphatic: Minimal aortic atherosclerosis. No aneurysm. No
other vascular abnormality. No enlarged lymph nodes.

Reproductive: Status post hysterectomy. No adnexal masses.

Other: Small fat containing umbilical hernia.  No bowel enters this.

Musculoskeletal: No acute or significant osseous findings.
IMPRESSION: 1. Partial small bowel obstruction with a transition point at the
level of the umbilicus, likely due to adhesions.
2. No other acute abnormality within the abdomen or pelvis.

## 2022-03-02 ENCOUNTER — Ambulatory Visit: Payer: 59

## 2022-03-16 ENCOUNTER — Ambulatory Visit
Admission: RE | Admit: 2022-03-16 | Discharge: 2022-03-16 | Disposition: A | Discharge status: Home / Self Care | Payer: 59 | Source: Ambulatory Visit | Attending: Gastroenterology | Admitting: Gastroenterology

## 2022-03-16 DIAGNOSIS — Z1231 Encounter for screening mammogram for malignant neoplasm of breast: Secondary | ICD-10-CM

## 2022-03-20 ENCOUNTER — Other Ambulatory Visit: Payer: Self-pay | Admitting: Gastroenterology

## 2022-03-20 DIAGNOSIS — R928 Other abnormal and inconclusive findings on diagnostic imaging of breast: Secondary | ICD-10-CM

## 2022-03-29 ENCOUNTER — Ambulatory Visit: Payer: 59

## 2022-03-29 ENCOUNTER — Ambulatory Visit
Admission: RE | Admit: 2022-03-29 | Discharge: 2022-03-29 | Disposition: A | Discharge status: Home / Self Care | Payer: 59 | Source: Ambulatory Visit | Attending: Gastroenterology | Admitting: Gastroenterology

## 2022-03-29 DIAGNOSIS — R928 Other abnormal and inconclusive findings on diagnostic imaging of breast: Secondary | ICD-10-CM

## 2023-04-04 ENCOUNTER — Other Ambulatory Visit: Payer: Self-pay | Admitting: Gastroenterology

## 2023-04-04 DIAGNOSIS — Z1231 Encounter for screening mammogram for malignant neoplasm of breast: Secondary | ICD-10-CM

## 2023-04-19 ENCOUNTER — Ambulatory Visit
Admission: RE | Admit: 2023-04-19 | Discharge: 2023-04-19 | Disposition: A | Discharge status: Home / Self Care | Payer: 59 | Source: Ambulatory Visit | Attending: Gastroenterology | Admitting: Gastroenterology

## 2023-04-19 DIAGNOSIS — Z1231 Encounter for screening mammogram for malignant neoplasm of breast: Secondary | ICD-10-CM

## 2024-03-17 ENCOUNTER — Other Ambulatory Visit: Payer: Self-pay | Admitting: Nurse Practitioner

## 2024-03-17 DIAGNOSIS — Z1231 Encounter for screening mammogram for malignant neoplasm of breast: Secondary | ICD-10-CM

## 2024-04-21 ENCOUNTER — Ambulatory Visit
Admission: RE | Admit: 2024-04-21 | Discharge: 2024-04-21 | Disposition: A | Discharge status: Home / Self Care | Source: Ambulatory Visit | Attending: Nurse Practitioner | Admitting: Nurse Practitioner

## 2024-04-21 DIAGNOSIS — Z1231 Encounter for screening mammogram for malignant neoplasm of breast: Secondary | ICD-10-CM
